# Patient Record
Sex: Male | Born: 1985 | Race: Black or African American | Hispanic: No | Marital: Married | Smoking: Never smoker
Health system: Southern US, Community
[De-identification: ages and names within clinical notes are randomized; demographics above are authoritative.]

## PROBLEM LIST (undated history)

## (undated) DIAGNOSIS — I2699 Other pulmonary embolism without acute cor pulmonale: Secondary | ICD-10-CM

## (undated) DIAGNOSIS — D571 Sickle-cell disease without crisis: Secondary | ICD-10-CM

## (undated) DIAGNOSIS — I249 Acute ischemic heart disease, unspecified: Secondary | ICD-10-CM

## (undated) DIAGNOSIS — I639 Cerebral infarction, unspecified: Secondary | ICD-10-CM

## (undated) HISTORY — PX: APPENDECTOMY: SHX54

---

## 2010-01-19 ENCOUNTER — Emergency Department (HOSPITAL_COMMUNITY): Admission: EM | Admit: 2010-01-19 | Discharge: 2010-01-19 | Payer: Self-pay | Admitting: Emergency Medicine

## 2010-03-16 ENCOUNTER — Emergency Department (HOSPITAL_COMMUNITY): Admission: EM | Admit: 2010-03-16 | Discharge: 2010-03-17 | Payer: Self-pay | Admitting: Emergency Medicine

## 2010-03-18 ENCOUNTER — Inpatient Hospital Stay (HOSPITAL_COMMUNITY): Admission: EM | Admit: 2010-03-18 | Discharge: 2010-03-23 | Payer: Self-pay | Admitting: Emergency Medicine

## 2010-09-24 ENCOUNTER — Emergency Department (HOSPITAL_COMMUNITY)
Admission: EM | Admit: 2010-09-24 | Discharge: 2010-09-24 | Payer: Self-pay | Source: Home / Self Care | Admitting: Emergency Medicine

## 2010-12-31 LAB — DIFFERENTIAL
Basophils Relative: 1 % (ref 0–1)
Eosinophils Relative: 0 % (ref 0–5)
Lymphocytes Relative: 17 % (ref 12–46)
Lymphs Abs: 0.9 10*3/uL (ref 0.7–4.0)
Monocytes Relative: 9 % (ref 3–12)

## 2010-12-31 LAB — CBC
HCT: 31.4 % — ABNORMAL LOW (ref 39.0–52.0)
MCHC: 34.1 g/dL (ref 30.0–36.0)
MCV: 88.7 fL (ref 78.0–100.0)
Platelets: 642 10*3/uL — ABNORMAL HIGH (ref 150–400)
RDW: 16.6 % — ABNORMAL HIGH (ref 11.5–15.5)
WBC: 5.2 10*3/uL (ref 4.0–10.5)

## 2010-12-31 LAB — BASIC METABOLIC PANEL
BUN: 10 mg/dL (ref 6–23)
CO2: 25 mEq/L (ref 19–32)
Glucose, Bld: 90 mg/dL (ref 70–99)

## 2011-01-06 LAB — BASIC METABOLIC PANEL
BUN: 10 mg/dL (ref 6–23)
BUN: 9 mg/dL (ref 6–23)
CO2: 33 mEq/L — ABNORMAL HIGH (ref 19–32)
Calcium: 9 mg/dL (ref 8.4–10.5)
Calcium: 9.2 mg/dL (ref 8.4–10.5)
Chloride: 104 mEq/L (ref 96–112)
Creatinine, Ser: 0.65 mg/dL (ref 0.4–1.5)
Creatinine, Ser: 0.59 mg/dL (ref 0.4–1.5)
GFR calc Af Amer: >60 mL/min (ref >60)
GFR calc Af Amer: >60 mL/min (ref >60)
GFR calc non Af Amer: >60 mL/min (ref >60)
Glucose, Bld: 98 mg/dL (ref 70–99)

## 2011-01-06 LAB — COMPREHENSIVE METABOLIC PANEL
Albumin: 3.3 g/dL — ABNORMAL LOW (ref 3.5–5.2)
Calcium: 8.5 mg/dL (ref 8.4–10.5)
Chloride: 110 mEq/L (ref 96–112)
Creatinine, Ser: 0.5 mg/dL (ref 0.4–1.5)
Glucose, Bld: 101 mg/dL — ABNORMAL HIGH (ref 70–99)
Sodium: 142 mEq/L (ref 135–145)
Total Protein: 5.9 g/dL — ABNORMAL LOW (ref 6.0–8.3)

## 2011-01-06 LAB — DIFFERENTIAL
Basophils Absolute: 0.1 10*3/uL (ref 0.0–0.1)
Basophils Relative: 0 % (ref 0–1)
Eosinophils Relative: 0 % (ref 0–5)
Eosinophils Relative: 4 % (ref 0–5)
Lymphocytes Relative: 11 % — ABNORMAL LOW (ref 12–46)
Lymphocytes Relative: 32 % (ref 12–46)
Lymphs Abs: 2.4 10*3/uL (ref 0.7–4.0)
Monocytes Absolute: 0.5 10*3/uL (ref 0.1–1.0)
Monocytes Absolute: 0.8 10*3/uL (ref 0.1–1.0)
Monocytes Relative: 10 % (ref 3–12)
Monocytes Relative: 12 % (ref 3–12)
Neutro Abs: 2.8 10*3/uL (ref 1.7–7.7)
Neutro Abs: 6.1 10*3/uL (ref 1.7–7.7)
Neutrophils Relative %: 29 % — ABNORMAL LOW (ref 43–77)
Neutrophils Relative %: 79 % — ABNORMAL HIGH (ref 43–77)

## 2011-01-06 LAB — CBC
Hemoglobin: 10.4 g/dL — ABNORMAL LOW (ref 13.0–17.0)
Hemoglobin: 9.9 g/dL — ABNORMAL LOW (ref 13.0–17.0)
Hemoglobin: 10.5 g/dL — ABNORMAL LOW (ref 13.0–17.0)
Hemoglobin: 12 g/dL — ABNORMAL LOW (ref 13.0–17.0)
Hemoglobin: 9.8 g/dL — ABNORMAL LOW (ref 13.0–17.0)
MCV: 91.4 fL (ref 78.0–100.0)
Platelets: 251 10*3/uL (ref 150–400)
Platelets: 258 10*3/uL (ref 150–400)
RBC: 3.22 MIL/uL — ABNORMAL LOW (ref 4.22–5.81)
RBC: 3.16 MIL/uL — ABNORMAL LOW (ref 4.22–5.81)
RDW: 15.6 % — ABNORMAL HIGH (ref 11.5–15.5)
RDW: 17.1 % — ABNORMAL HIGH (ref 11.5–15.5)
RDW: 16.6 % — ABNORMAL HIGH (ref 11.5–15.5)
RDW: 17 % — ABNORMAL HIGH (ref 11.5–15.5)
WBC: 5 10*3/uL (ref 4.0–10.5)
WBC: 4.4 10*3/uL (ref 4.0–10.5)

## 2011-01-06 LAB — POCT I-STAT, CHEM 8
Calcium, Ion: 1.07 mmol/L — ABNORMAL LOW (ref 1.12–1.32)
Creatinine, Ser: 0.4 mg/dL (ref 0.4–1.5)
Glucose, Bld: 117 mg/dL — ABNORMAL HIGH (ref 70–99)
HCT: 33 % — ABNORMAL LOW (ref 39.0–52.0)
Hemoglobin: 11.2 g/dL — ABNORMAL LOW (ref 13.0–17.0)
Potassium: 3.1 mEq/L — ABNORMAL LOW (ref 3.5–5.1)
TCO2: 25 mmol/L (ref 0–100)

## 2011-01-06 LAB — RETICULOCYTES
RBC.: 3.85 MIL/uL — ABNORMAL LOW (ref 4.22–5.81)
Retic Count, Absolute: 319.6 10*3/uL — ABNORMAL HIGH (ref 19.0–186.0)
Retic Ct Pct: 8.3 % — ABNORMAL HIGH (ref 0.4–3.1)

## 2011-01-06 LAB — URINALYSIS, ROUTINE W REFLEX MICROSCOPIC
Glucose, UA: NEGATIVE mg/dL
Hgb urine dipstick: NEGATIVE
Protein, ur: NEGATIVE mg/dL

## 2011-01-06 LAB — URINE MICROSCOPIC-ADD ON

## 2011-01-08 LAB — CBC
Platelets: 384 10*3/uL (ref 150–400)
RBC: 3.12 MIL/uL — ABNORMAL LOW (ref 4.22–5.81)
RDW: 17.2 % — ABNORMAL HIGH (ref 11.5–15.5)
WBC: 9.9 10*3/uL (ref 4.0–10.5)

## 2011-01-08 LAB — POCT I-STAT, CHEM 8
BUN: 12 mg/dL (ref 6–23)
Calcium, Ion: 1.13 mmol/L (ref 1.12–1.32)
Chloride: 108 mEq/L (ref 96–112)
Glucose, Bld: 103 mg/dL — ABNORMAL HIGH (ref 70–99)
HCT: 31 % — ABNORMAL LOW (ref 39.0–52.0)
TCO2: 23 mmol/L (ref 0–100)

## 2011-01-08 LAB — DIFFERENTIAL
Basophils Absolute: 0 10*3/uL (ref 0.0–0.1)
Basophils Relative: 0 % (ref 0–1)
Eosinophils Absolute: 0 10*3/uL (ref 0.0–0.7)
Neutro Abs: 7.8 10*3/uL — ABNORMAL HIGH (ref 1.7–7.7)

## 2011-01-08 LAB — POCT CARDIAC MARKERS: Troponin i, poc: <0.05 ng/mL (ref 0.00–0.09)

## 2011-01-08 LAB — RETICULOCYTES: Retic Count, Absolute: 117.7 10*3/uL (ref 19.0–186.0)

## 2011-10-06 ENCOUNTER — Encounter: Payer: Self-pay | Admitting: Emergency Medicine

## 2011-10-06 ENCOUNTER — Other Ambulatory Visit: Payer: Self-pay

## 2011-10-06 ENCOUNTER — Observation Stay (HOSPITAL_COMMUNITY)
Admission: EM | Admit: 2011-10-06 | Discharge: 2011-10-07 | Disposition: A | Discharge status: Home / Self Care | Payer: PRIVATE HEALTH INSURANCE | Attending: Emergency Medicine | Admitting: Emergency Medicine

## 2011-10-06 ENCOUNTER — Emergency Department (HOSPITAL_COMMUNITY): Payer: PRIVATE HEALTH INSURANCE

## 2011-10-06 DIAGNOSIS — D5700 Hb-SS disease with crisis, unspecified: Principal | ICD-10-CM | POA: Insufficient documentation

## 2011-10-06 HISTORY — DX: Sickle-cell disease without crisis: D57.1

## 2011-10-06 LAB — DIFFERENTIAL
Basophils Absolute: 0.1 10*3/uL (ref 0.0–0.1)
Lymphocytes Relative: 10 % — ABNORMAL LOW (ref 12–46)
Neutro Abs: 9.3 10*3/uL — ABNORMAL HIGH (ref 1.7–7.7)

## 2011-10-06 LAB — BASIC METABOLIC PANEL
CO2: 24 mEq/L (ref 19–32)
Chloride: 104 mEq/L (ref 96–112)
Potassium: 3.8 mEq/L (ref 3.5–5.1)
Sodium: 139 mEq/L (ref 135–145)

## 2011-10-06 LAB — CBC
Platelets: 837 10*3/uL — ABNORMAL HIGH (ref 150–400)
RDW: 19.4 % — ABNORMAL HIGH (ref 11.5–15.5)
WBC: 11.2 10*3/uL — ABNORMAL HIGH (ref 4.0–10.5)

## 2011-10-06 LAB — RETICULOCYTES
Retic Count, Absolute: 397.8 10*3/uL — ABNORMAL HIGH (ref 19.0–186.0)
Retic Ct Pct: 15.3 % — ABNORMAL HIGH (ref 0.4–3.1)

## 2011-10-06 MED ORDER — HYDROMORPHONE HCL PF 1 MG/ML IJ SOLN
2.0000 mg | INTRAMUSCULAR | Status: DC | PRN
  Administered 2011-10-07 (×3): 2 mg via INTRAVENOUS
  Filled 2011-10-06 (×3): qty 2

## 2011-10-06 MED ORDER — SODIUM CHLORIDE 0.9 % IV SOLN
Freq: Once | INTRAVENOUS | Status: AC
  Administered 2011-10-06: 1000 mL/h via INTRAVENOUS

## 2011-10-06 MED ORDER — HYDROMORPHONE HCL PF 2 MG/ML IJ SOLN
2.0000 mg | Freq: Once | INTRAMUSCULAR | Status: AC
  Administered 2011-10-06: 2 mg via INTRAVENOUS
  Filled 2011-10-06: qty 1

## 2011-10-06 MED ORDER — SODIUM CHLORIDE 0.9 % IV SOLN
INTRAVENOUS | Status: DC
  Administered 2011-10-07: 03:00:00 via INTRAVENOUS

## 2011-10-06 NOTE — ED Provider Notes (Signed)
History     CSN: [id number] Arrival date & time: 10/06/2011  8:45 PM   First MD Initiated Contact with Patient 10/06/11 2232      No chief complaint on file.   (Consider location/radiation/quality/duration/timing/severity/associated sxs/prior treatment) HPI Comments: History of sickle cell disease.  Is from [place] where he usually receives care.  Pain started yesterday and is diffuse, but worst in his back.  No fever or chills.  Patient is a 25 y.o. male presenting with sickle cell pain. The history is provided by the patient.  Sickle Cell Pain Crisis  This is a recurrent problem. The current episode started yesterday. The problem occurs occasionally. The problem has been rapidly worsening. The pain location is generalized. Site of pain is localized in bone. The pain is moderate.    Past Medical History  Diagnosis Date  . Sickle cell anemia     Past Surgical History  Procedure Date  . Appendectomy     No family history on file.  History  Substance Use Topics  . Smoking status: Never Smoker   . Smokeless tobacco: Not on file  . Alcohol Use: No      Review of Systems  All other systems reviewed and are negative.    Allergies  Sulfa antibiotics  Home Medications   Current Outpatient Rx  Name Route Sig Dispense Refill  . FOLIC ACID 1 MG PO TABS Oral Take 1 mg by mouth daily.      Marland Kitchen HYDROXYUREA 500 MG PO CAPS Oral Take 1,000 mg by mouth daily. May take with food to minimize GI side effects.     . OXYCODONE HCL ER 10 MG PO TB12 Oral Take 10 mg by mouth every 4 (four) hours as needed. For pain.       BP 116/66  Pulse 113  Temp(Src) 99.4 F (37.4 C) (Oral)  Resp 20  SpO2 98%  Physical Exam  Nursing note and vitals reviewed. Constitutional: He is oriented to person, place, and time. He appears well-developed and well-nourished.  HENT:  Head: Normocephalic and atraumatic.  Neck: Normal range of motion. Neck supple.  Cardiovascular: Normal rate and regular  rhythm.  Exam reveals no friction rub.   No murmur heard. Pulmonary/Chest: Effort normal and breath sounds normal. No respiratory distress.  Abdominal: Soft. Bowel sounds are normal. He exhibits no distension. There is no tenderness.  Musculoskeletal: Normal range of motion. He exhibits no edema.  Neurological: He is alert and oriented to person, place, and time.  Skin: Skin is warm and dry.    ED Course  Procedures (including critical care time)  Labs Reviewed  RETICULOCYTES - Abnormal; Notable for the following:    Retic Ct Pct 15.3 (*)    RBC. 2.60 (*)    Retic Count, Manual 397.8 (*)    All other components within normal limits  CBC - Abnormal; Notable for the following:    WBC 11.2 (*)    RBC 2.60 (*)    Hemoglobin 8.9 (*)    HCT 25.8 (*)    MCH 34.2 (*)    RDW 19.4 (*)    Platelets 837 (*)    All other components within normal limits  DIFFERENTIAL - Abnormal; Notable for the following:    Neutrophils Relative 83 (*)    Neutro Abs 9.3 (*)    Lymphocytes Relative 10 (*)    All other components within normal limits  BASIC METABOLIC PANEL - Abnormal; Notable for the following:    Glucose,  Bld 101 (*)    All other components within normal limits   Dg Chest 2 View  10/06/2011  *RADIOLOGY REPORT*  Clinical Data: Chest pain, sickle cell crisis  CHEST - 2 VIEW  Comparison:  03/16/2010  Findings:  The heart size and mediastinal contours are within normal limits.  Both lungs are clear.  The visualized skeletal structures are unremarkable.  IMPRESSION: No active cardiopulmonary disease.  Original Report Authenticated By: Judie Petit. Ruel Favors, M.D.     No diagnosis found.    MDM  Patient will be moved to the CDU for sickle cell protocol.          Geoffery Lyons, MD 10/06/11 [phone]

## 2011-10-06 NOTE — ED Notes (Signed)
PT. REPORTS SICKLE CELL CRISIS WITH RIGHT CHEST PAIN ONSET  LAST Friday .  SLIGHT NAUSEA.

## 2011-10-07 MED ORDER — OXYCODONE-ACETAMINOPHEN 5-325 MG PO TABS: 1.0000 | ORAL_TABLET | ORAL | Status: AC | PRN

## 2011-10-07 NOTE — ED Notes (Signed)
patient here for sickle cell crisis. Rates pain as 8/10.

## 2011-11-30 ENCOUNTER — Encounter (HOSPITAL_COMMUNITY): Payer: Self-pay | Admitting: Emergency Medicine

## 2011-11-30 ENCOUNTER — Emergency Department (HOSPITAL_COMMUNITY)
Admission: EM | Admit: 2011-11-30 | Discharge: 2011-12-01 | Disposition: A | Discharge status: Home / Self Care | Payer: PRIVATE HEALTH INSURANCE | Attending: Emergency Medicine | Admitting: Emergency Medicine

## 2011-11-30 DIAGNOSIS — M25519 Pain in unspecified shoulder: Secondary | ICD-10-CM | POA: Insufficient documentation

## 2011-11-30 DIAGNOSIS — D5700 Hb-SS disease with crisis, unspecified: Secondary | ICD-10-CM | POA: Insufficient documentation

## 2011-11-30 DIAGNOSIS — M79609 Pain in unspecified limb: Secondary | ICD-10-CM | POA: Insufficient documentation

## 2011-11-30 LAB — DIFFERENTIAL
Basophils Relative: 0 % (ref 0–1)
Eosinophils Absolute: 0.1 10*3/uL (ref 0.0–0.7)
Lymphs Abs: 0.6 10*3/uL — ABNORMAL LOW (ref 0.7–4.0)
Monocytes Absolute: 0.8 10*3/uL (ref 0.1–1.0)
Monocytes Relative: 9 % (ref 3–12)
Neutro Abs: 7.3 10*3/uL (ref 1.7–7.7)

## 2011-11-30 LAB — RETICULOCYTES
RBC.: 2.51 MIL/uL — ABNORMAL LOW (ref 4.22–5.81)
Retic Ct Pct: 19.5 % — ABNORMAL HIGH (ref 0.4–3.1)

## 2011-11-30 LAB — POCT I-STAT, CHEM 8
BUN: 4 mg/dL — ABNORMAL LOW (ref 6–23)
Chloride: 104 mEq/L (ref 96–112)
Creatinine, Ser: 0.5 mg/dL (ref 0.50–1.35)
Glucose, Bld: 97 mg/dL (ref 70–99)
Potassium: 3.8 mEq/L (ref 3.5–5.1)
Sodium: 141 mEq/L (ref 135–145)

## 2011-11-30 LAB — CBC
HCT: 25.9 % — ABNORMAL LOW (ref 39.0–52.0)
Hemoglobin: 9.2 g/dL — ABNORMAL LOW (ref 13.0–17.0)
MCH: 36.7 pg — ABNORMAL HIGH (ref 26.0–34.0)
MCHC: 35.5 g/dL (ref 30.0–36.0)
RBC: 2.51 MIL/uL — ABNORMAL LOW (ref 4.22–5.81)

## 2011-11-30 LAB — ETHANOL: Alcohol, Ethyl (B): <11 mg/dL (ref 0–11)

## 2011-11-30 MED ORDER — DIPHENHYDRAMINE HCL 50 MG/ML IJ SOLN
25.0000 mg | Freq: Once | INTRAMUSCULAR | Status: AC
  Administered 2011-11-30: via INTRAVENOUS
  Filled 2011-11-30: qty 1

## 2011-11-30 MED ORDER — ZIPRASIDONE MESYLATE 20 MG IM SOLR: 20.0000 mg | Freq: Once | INTRAMUSCULAR | Status: DC

## 2011-11-30 MED ORDER — FOLIC ACID 1 MG PO TABS
1.0000 mg | ORAL_TABLET | Freq: Once | ORAL | Status: DC
  Filled 2011-11-30: qty 1

## 2011-11-30 MED ORDER — THIAMINE HCL 100 MG/ML IJ SOLN: 100.0000 mg | Freq: Once | INTRAMUSCULAR | Status: DC

## 2011-11-30 MED ORDER — ONDANSETRON HCL 4 MG/2ML IJ SOLN
4.0000 mg | Freq: Once | INTRAMUSCULAR | Status: AC
  Administered 2011-11-30: 4 mg via INTRAVENOUS
  Filled 2011-11-30: qty 2

## 2011-11-30 MED ORDER — SODIUM CHLORIDE 0.9 % IV BOLUS (SEPSIS)
1000.0000 mL | Freq: Once | INTRAVENOUS | Status: AC
  Administered 2011-11-30: 1000 mL via INTRAVENOUS

## 2011-11-30 MED ORDER — SODIUM CHLORIDE 0.9 % IV BOLUS (SEPSIS): 1000.0000 mL | Freq: Once | INTRAVENOUS | Status: DC

## 2011-11-30 MED ORDER — HYDROMORPHONE HCL PF 2 MG/ML IJ SOLN
2.0000 mg | Freq: Once | INTRAMUSCULAR | Status: AC
  Administered 2011-11-30: 2 mg via INTRAVENOUS
  Filled 2011-11-30: qty 1

## 2011-11-30 NOTE — ED Notes (Signed)
C/o sickle cell crisis since Wednesday night.  C/o pain to R arm, R shoulder, and back.

## 2011-12-01 LAB — COMPREHENSIVE METABOLIC PANEL
Albumin: 3.9 g/dL (ref 3.5–5.2)
Alkaline Phosphatase: 61 U/L (ref 39–117)
BUN: 6 mg/dL (ref 6–23)
Chloride: 102 mEq/L (ref 96–112)
Creatinine, Ser: 0.54 mg/dL (ref 0.50–1.35)
GFR calc Af Amer: >90 mL/min (ref >90)
Glucose, Bld: 88 mg/dL (ref 70–99)
Potassium: 3.6 mEq/L (ref 3.5–5.1)
Total Bilirubin: 2 mg/dL — ABNORMAL HIGH (ref 0.3–1.2)

## 2011-12-01 MED ORDER — HYDROMORPHONE HCL PF 2 MG/ML IJ SOLN
INTRAMUSCULAR | Status: AC
  Filled 2011-12-01: qty 1

## 2011-12-01 MED ORDER — HYDROMORPHONE HCL PF 2 MG/ML IJ SOLN
2.0000 mg | Freq: Once | INTRAMUSCULAR | Status: AC
  Administered 2011-12-01: 2 mg via INTRAVENOUS

## 2011-12-01 MED ORDER — HYDROMORPHONE HCL PF 2 MG/ML IJ SOLN
2.0000 mg | Freq: Once | INTRAMUSCULAR | Status: AC
  Administered 2011-12-01: 2 mg via INTRAVENOUS
  Filled 2011-12-01: qty 1

## 2011-12-01 NOTE — ED Provider Notes (Cosign Needed)
History     CSN: [id number]  Arrival date & time 11/30/11  2145   First MD Initiated Contact with Patient 11/30/11 2245      Chief Complaint  Patient presents with  . Sickle Cell Pain Crisis    (Consider location/radiation/quality/duration/timing/severity/associated sxs/prior treatment) HPI Comments: The patient is a 26 year old man with sickle cell disease. He started having pain in his right arm and right shoulder on Wednesday, 5 days ago. The pain is gotten progressively worse. He is taken oxycodone without relief. He therefore seeks evaluation.  Patient is a 26 y.o. male presenting with sickle cell pain. The history is provided by the patient and medical records. No language interpreter was used.  Sickle Cell Pain Crisis  This is a recurrent problem. The current episode started 3 to 5 days ago. The onset was gradual. The problem occurs continuously. The problem has been gradually worsening. The pain is present in the right side. Site of pain is localized in bone. The pain is similar to prior episodes. The pain is severe. The symptoms are relieved by nothing. Associated symptoms include joint pain. Pertinent negatives include no loss of sensation, no tingling and no weakness. He sickle cell is of an an unknown genotype. There is no history of acute chest syndrome. There have been frequent pain crises. There is no history of platelet sequestration. There is no history of stroke. He has not been treated with chronic transfusion therapy. He has been treated with hydroxyurea.    Past Medical History  Diagnosis Date  . Sickle cell anemia     Past Surgical History  Procedure Date  . Appendectomy     No family history on file.  History  Substance Use Topics  . Smoking status: Never Smoker   . Smokeless tobacco: Not on file  . Alcohol Use: No      Review of Systems  Constitutional: Negative.   HENT: Negative.   Eyes: Negative.   Respiratory: Negative.   Cardiovascular:  Negative.   Gastrointestinal: Negative.   Genitourinary: Negative.   Musculoskeletal: Positive for joint pain.       Pain in right arm and right shoulder.  Skin: Negative.   Neurological: Negative.  Negative for tingling and weakness.  Psychiatric/Behavioral: Negative.     Allergies  Sulfa antibiotics  Home Medications   Current Outpatient Rx  Name Route Sig Dispense Refill  . FOLIC ACID 1 MG PO TABS Oral Take 1 mg by mouth daily.      Marland Kitchen HYDROXYUREA 500 MG PO CAPS Oral Take 1,000 mg by mouth daily. May take with food to minimize GI side effects.     . OXYCODONE HCL ER 10 MG PO TB12 Oral Take 10 mg by mouth every 4 (four) hours as needed. For pain.       BP 121/74  Pulse 120  Temp(Src) 99.1 F (37.3 C) (Oral)  Resp 18  SpO2 98%  Physical Exam  Nursing note and vitals reviewed. Constitutional: He is oriented to person, place, and time. He appears well-developed and well-nourished.       In moderate distress with pain in the right arm and shoulder and right lateral chest.  HENT:  Head: Normocephalic and atraumatic.  Right Ear: External ear normal.  Left Ear: External ear normal.  Mouth/Throat: Oropharynx is clear and moist.  Eyes: Conjunctivae and EOM are normal. Pupils are equal, round, and reactive to light.  Neck: Normal range of motion. Neck supple.  Cardiovascular: Normal rate, regular rhythm  and normal heart sounds.   Pulmonary/Chest: Effort normal and breath sounds normal.  Abdominal: Soft. Bowel sounds are normal.  Musculoskeletal: Normal range of motion.  Neurological: He is alert and oriented to person, place, and time.       No sensory or motor deficit.   Skin: Skin is warm and dry.  Psychiatric: He has a normal mood and affect. His behavior is normal.    ED Course  Procedures (including critical care time)  Labs Reviewed  POCT I-STAT, CHEM 8 - Abnormal; Notable for the following:    BUN 4 (*)    Calcium, Ion 1.06 (*)    Hemoglobin 9.9 (*)    HCT  29.0 (*)    All other components within normal limits  CBC - Abnormal; Notable for the following:    RBC 2.51 (*)    Hemoglobin 9.2 (*)    HCT 25.9 (*)    MCV 103.2 (*)    MCH 36.7 (*)    RDW 20.6 (*)    All other components within normal limits  DIFFERENTIAL - Abnormal; Notable for the following:    Neutrophils Relative 83 (*)    Lymphocytes Relative 7 (*)    Lymphs Abs 0.6 (*)    All other components within normal limits  COMPREHENSIVE METABOLIC PANEL - Abnormal; Notable for the following:    AST 46 (*) HEMOLYSIS AT THIS LEVEL MAY AFFECT RESULT   Total Bilirubin 2.0 (*)    All other components within normal limits  RETICULOCYTES - Abnormal; Notable for the following:    Retic Ct Pct 19.5 (*)    RBC. 2.51 (*)    Retic Count, Manual 489.5 (*)    All other components within normal limits  ETHANOL  URINALYSIS, ROUTINE W REFLEX MICROSCOPIC    1:00 AM Pt seen --> physical exam performed. Lab workup performed. IV fluids, oxygen, Dilaudid, Zofran and Benadryl ordered. Observe to see if his pain will be controllable.  1. Sickle cell crisis     Signed out to Dr. Hyacinth Meeker at 1:00 A.M.       Carleene Cooper III, MD 12/01/11 [phone]

## 2011-12-01 NOTE — ED Provider Notes (Signed)
  Physical Exam  BP 114/68  Pulse 98  Temp(Src) 99.1 F (37.3 C) (Oral)  Resp 16  SpO2 93%  Physical Exam  ED Course  Procedures  MDM Patient received in change of shift. He has had right arm and shoulder pain for the last 5 days. He is taking oxycodone at home without significant improvement of his pain. He denies fevers, chills, shortness of breath, chest pain, back pain, dysuria. He denies any swelling of his arm. He states that this is a common exacerbation for him. He is usually seen at [facility] or at [facility].    Physical exam, tachycardia has resolved, patient states the pain is now 8/10, no swelling of the right arm, normal pulses and range of motion of the right arm, abdomen soft and lungs and heart are clear without any rales, wheezing, respiratory distress or tachycardia. Abdomen is soft and nontender  Assessment:  Repeat pain medication, and hydromorphone 2 mg given 2 hours ago, will repeat the dose and then repeat again. Will need admission if no significant improvement.  4:00 AM, patient reevaluated and has minimal pain in his amenable to discharge at this time      Vida Roller, MD 12/01/11 [phone]

## 2011-12-01 NOTE — ED Notes (Signed)
Urine requested patient states unable

## 2011-12-01 NOTE — ED Notes (Signed)
Patient reports sickle cell crisis started on Wednesday states pain in right shoulder and left arm rates it 10/10

## 2011-12-25 ENCOUNTER — Observation Stay (HOSPITAL_COMMUNITY)
Admission: EM | Admit: 2011-12-25 | Discharge: 2011-12-26 | Disposition: A | Discharge status: Home Health | Payer: PRIVATE HEALTH INSURANCE | Attending: Emergency Medicine | Admitting: Emergency Medicine

## 2011-12-25 ENCOUNTER — Emergency Department (HOSPITAL_COMMUNITY): Payer: PRIVATE HEALTH INSURANCE

## 2011-12-25 ENCOUNTER — Encounter (HOSPITAL_COMMUNITY): Payer: Self-pay | Admitting: Emergency Medicine

## 2011-12-25 DIAGNOSIS — R000 Tachycardia, unspecified: Secondary | ICD-10-CM | POA: Insufficient documentation

## 2011-12-25 DIAGNOSIS — R112 Nausea with vomiting, unspecified: Secondary | ICD-10-CM | POA: Insufficient documentation

## 2011-12-25 DIAGNOSIS — D5700 Hb-SS disease with crisis, unspecified: Principal | ICD-10-CM | POA: Insufficient documentation

## 2011-12-25 LAB — HEPATIC FUNCTION PANEL
Albumin: 4.6 g/dL (ref 3.5–5.2)
Alkaline Phosphatase: 56 U/L (ref 39–117)
Bilirubin, Direct: 0.4 mg/dL — ABNORMAL HIGH (ref 0.0–0.3)
Total Bilirubin: 1.8 mg/dL — ABNORMAL HIGH (ref 0.3–1.2)

## 2011-12-25 LAB — CBC
MCH: 36.2 pg — ABNORMAL HIGH (ref 26.0–34.0)
MCHC: 35.4 g/dL (ref 30.0–36.0)
MCV: 102.3 fL — ABNORMAL HIGH (ref 78.0–100.0)
Platelets: 577 10*3/uL — ABNORMAL HIGH (ref 150–400)
RBC: 2.57 MIL/uL — ABNORMAL LOW (ref 4.22–5.81)
RDW: 19.4 % — ABNORMAL HIGH (ref 11.5–15.5)

## 2011-12-25 LAB — RETICULOCYTES
RBC.: 2.57 MIL/uL — ABNORMAL LOW (ref 4.22–5.81)
Retic Ct Pct: 11.9 % — ABNORMAL HIGH (ref 0.4–3.1)

## 2011-12-25 LAB — BASIC METABOLIC PANEL
CO2: 25 mEq/L (ref 19–32)
Calcium: 9.2 mg/dL (ref 8.4–10.5)
Creatinine, Ser: 0.66 mg/dL (ref 0.50–1.35)
GFR calc Af Amer: >90 mL/min (ref >90)
GFR calc non Af Amer: >90 mL/min (ref >90)
Sodium: 140 mEq/L (ref 135–145)

## 2011-12-25 LAB — DIFFERENTIAL
Basophils Absolute: 0 10*3/uL (ref 0.0–0.1)
Eosinophils Absolute: 0 10*3/uL (ref 0.0–0.7)
Lymphs Abs: 1.1 10*3/uL (ref 0.7–4.0)
Monocytes Absolute: 0.6 10*3/uL (ref 0.1–1.0)
Neutrophils Relative %: 77 % (ref 43–77)

## 2011-12-25 LAB — LIPASE, BLOOD: Lipase: 15 U/L (ref 11–59)

## 2011-12-25 MED ORDER — DIPHENHYDRAMINE HCL 50 MG/ML IJ SOLN
12.5000 mg | Freq: Four times a day (QID) | INTRAMUSCULAR | Status: DC | PRN
  Administered 2011-12-26 (×2): 12.5 mg via INTRAVENOUS
  Filled 2011-12-25 (×2): qty 1

## 2011-12-25 MED ORDER — ONDANSETRON HCL 4 MG/2ML IJ SOLN
4.0000 mg | Freq: Once | INTRAMUSCULAR | Status: AC
  Administered 2011-12-25: 4 mg via INTRAVENOUS
  Filled 2011-12-25: qty 2

## 2011-12-25 MED ORDER — DIPHENHYDRAMINE HCL 50 MG/ML IJ SOLN
25.0000 mg | Freq: Once | INTRAMUSCULAR | Status: AC
  Administered 2011-12-25: 25 mg via INTRAVENOUS
  Filled 2011-12-25: qty 1

## 2011-12-25 MED ORDER — DEXTROSE-NACL 5-0.45 % IV SOLN
INTRAVENOUS | Status: DC
  Administered 2011-12-25 – 2011-12-26 (×4): via INTRAVENOUS

## 2011-12-25 MED ORDER — HYDROMORPHONE HCL PF 1 MG/ML IJ SOLN
1.0000 mg | Freq: Once | INTRAMUSCULAR | Status: DC
  Administered 2011-12-25: 1 mg via INTRAVENOUS
  Filled 2011-12-25: qty 1

## 2011-12-25 MED ORDER — ONDANSETRON HCL 4 MG/2ML IJ SOLN
4.0000 mg | Freq: Four times a day (QID) | INTRAMUSCULAR | Status: DC | PRN
  Administered 2011-12-26 (×2): 4 mg via INTRAVENOUS
  Filled 2011-12-25 (×2): qty 2

## 2011-12-25 MED ORDER — HYDROMORPHONE HCL PF 2 MG/ML IJ SOLN
2.0000 mg | Freq: Once | INTRAMUSCULAR | Status: AC
  Administered 2011-12-25: 1 mg via INTRAVENOUS
  Filled 2011-12-25: qty 1

## 2011-12-25 MED ORDER — HYDROMORPHONE HCL PF 1 MG/ML IJ SOLN
2.0000 mg | INTRAMUSCULAR | Status: DC | PRN
  Administered 2011-12-26 (×3): 2 mg via INTRAVENOUS
  Filled 2011-12-25: qty 1
  Filled 2011-12-25 (×2): qty 2
  Filled 2011-12-25: qty 1

## 2011-12-25 NOTE — ED Notes (Signed)
PT. REPORTS PROGRESSING GENERALIZED BODY ACHES Justin Sharp FOR 4 DAYS WITH NAUSEA , DENIES FEVER OR CHILLS. NO SOB.

## 2011-12-25 NOTE — ED Notes (Signed)
The pt says his pain is  Still there.  8/10

## 2011-12-25 NOTE — ED Provider Notes (Signed)
History     CSN: [id number]  Arrival date & time 12/25/11  2119   First MD Initiated Contact with Patient 12/25/11 2145      Chief Complaint  Patient presents with  . Sickle Cell Pain Crisis    (Consider location/radiation/quality/duration/timing/severity/associated sxs/prior treatment) HPI Patient is a 26 year old male with a history of sickle cell disease who presents today complaining of pain typical of prior pain crises. He complains of 8/10 pain in the bilateral lower shiny some lower back. He denies any chest pain or shortness of breath. He denies any fevers. He was last transfused approximately 6 weeks ago. He has not had any fevers or difficulty breathing. Patient does endorse some nausea and an episode of vomiting. He denies any urinary symptoms or focal abdominal pain. Patient does have a history of an appendectomy as well as acute chest crisis. He's not having pain similar to these events. Patient had his home pain medication but was unable control his symptoms. There are no other associated or modifying factors. Past Medical History  Diagnosis Date  . Sickle cell anemia     Past Surgical History  Procedure Date  . Appendectomy     No family history on file.  History  Substance Use Topics  . Smoking status: Never Smoker   . Smokeless tobacco: Not on file  . Alcohol Use: No      Review of Systems  Constitutional: Negative.   HENT: Negative.   Eyes: Negative.   Respiratory: Negative.   Cardiovascular: Negative.   Gastrointestinal: Positive for nausea, vomiting and abdominal pain.  Genitourinary: Negative.   Musculoskeletal:       Muscle pain in the lower extremities as well as arthralgias consistent with prior sickle cell pain crises  Skin: Negative.   Neurological: Negative.   Hematological: Negative.   Psychiatric/Behavioral: Negative.   All other systems reviewed and are negative.    Allergies  Sulfa antibiotics  Home Medications   Current  Outpatient Rx  Name Route Sig Dispense Refill  . FOLIC ACID 1 MG PO TABS Oral Take 1 mg by mouth daily.      Marland Kitchen HYDROXYUREA 500 MG PO CAPS Oral Take 1,000 mg by mouth daily. May take with food to minimize GI side effects.     . OXYCODONE HCL 5 MG PO CAPS Oral Take 10 mg by mouth every 4 (four) hours as needed. For pain      BP 113/69  Pulse 103  Temp(Src) 98.6 F (37 C) (Oral)  Resp 16  SpO2 94%  Physical Exam  Nursing note and vitals reviewed. GEN: Well-developed, well-nourished male in no distress, uncomfortable appearing HEENT: Atraumatic, normocephalic. Oropharynx clear without erythema EYES: PERRLA BL, no scleral icterus. NECK: Trachea midline, no meningismus CV: regular rate and rhythm. No murmurs, rubs, or gallops PULM: No respiratory distress.  No crackles, wheezes, or rales. GI: soft, non-tender. No guarding, rebound, or tenderness. + bowel sounds  Neuro: cranial nerves 2-12 intact, no abnormalities of strength or sensation, A and O x 3 MSK: Patient moves all 4 extremities symmetrically, no deformity, edema, or injury noted Psych: no abnormality of mood   ED Course  Procedures (including critical care time)  Labs Reviewed  CBC - Abnormal; Notable for the following:    RBC 2.57 (*)    Hemoglobin 9.3 (*)    HCT 26.3 (*)    MCV 102.3 (*)    MCH 36.2 (*)    RDW 19.4 (*)    Platelets  577 (*)    All other components within normal limits  BASIC METABOLIC PANEL - Abnormal; Notable for the following:    Glucose, Bld 109 (*)    All other components within normal limits  RETICULOCYTES - Abnormal; Notable for the following:    Retic Ct Pct 11.9 (*)    RBC. 2.57 (*)    Retic Count, Manual 305.8 (*)    All other components within normal limits  HEPATIC FUNCTION PANEL - Abnormal; Notable for the following:    AST 41 (*)    Total Bilirubin 1.8 (*)    Bilirubin, Direct 0.4 (*)    Indirect Bilirubin 1.4 (*)    All other components within normal limits  DIFFERENTIAL    LIPASE, BLOOD  URINALYSIS, ROUTINE W REFLEX MICROSCOPIC   Dg Chest 2 View  12/25/2011  *RADIOLOGY REPORT*  Clinical Data: Sickle cell crisis, generalized pain  CHEST - 2 VIEW  Comparison: 10/06/2011  Findings: Upper-normal size of cardiac silhouette. Slight pulmonary vascular congestion. Mediastinal contours normal. Lungs clear. No pleural effusion or pneumothorax. No acute osseous findings.  IMPRESSION: No acute abnormalities.  Original Report Authenticated By: Lollie Marrow, M.D.     1. Sickle cell pain crisis       MDM  Patient was evaluated by myself. Based on his symptoms patient had workup with abdominal labs as well as evaluation for his sickle cell pain crisis. Chest x-ray was unremarkable. Patient did not have a leukocytosis. Hemoglobin was 9.3 and stable with prior values. Patient did not have significant dehydration. Also liver function was not significantly changed compared to prior. Patient had appropriately elevated reticulocyte count given symptoms. Patient is normally treated with Dilaudid 2 mg, Zofran, and Benadryl. He has previously been admitted to the CDU protocol with resolution of his symptoms. The patient was tachycardic initially he was within normal range for heart rate on my evaluation. Patient will be transferred to CDU for CDU protocol. Patient was comfortable with plan. CDU orders were entered and report was given to the CDU PA.        Cyndra Numbers, MD 12/25/11 2335

## 2011-12-25 NOTE — ED Provider Notes (Signed)
CDU admission note  26 year old male with history of sickle cell disease presents with sickle cell pain crisis. Symptoms include bilateral lower extremity discomfort, lower back pain, diffuse abdominal pain, and nausea consistent with prior exacerbations. Symptoms improving after initial negative workup and first dose of medications. Patient had unremarkable laboratory workup for abdominal pain as well as a negative chest x-ray.  CV: Regular rate and rhythm, no murmurs rubs or gallops Pulmonary: Clear to auscultation bilaterally, no crackles wheezes or rales GI: Soft diffuse discomfort on palpation no focal tenderness, no rebound or guarding, positive bowel sounds  Plan: Admit to CDU protocol overnight for pain control. Will disposition based on response to therapy overnight. Orders entered and report given to PA.  Cyndra Numbers, MD 12/25/11 2337

## 2011-12-25 NOTE — ED Notes (Signed)
Pain med given 

## 2011-12-25 NOTE — ED Notes (Signed)
Iv team called to  Start iv .  He has multiple bruising over both his arms from labs and ivs

## 2011-12-25 NOTE — ED Notes (Signed)
The pt has had pain all over his body since Monday with nausea.  He sees a doctor in [place].  He has had recent ivs and blood draws...  No porta-cath or hickman

## 2011-12-26 MED ORDER — HYDROMORPHONE HCL PF 2 MG/ML IJ SOLN
2.0000 mg | INTRAMUSCULAR | Status: DC | PRN
  Administered 2011-12-26: 2 mg via INTRAVENOUS

## 2011-12-26 MED ORDER — OXYCODONE-ACETAMINOPHEN 5-325 MG PO TABS
2.0000 | ORAL_TABLET | Freq: Once | ORAL | Status: AC
  Administered 2011-12-26: 2 via ORAL
  Filled 2011-12-26: qty 2

## 2011-12-26 MED ORDER — HYDROMORPHONE HCL PF 2 MG/ML IJ SOLN
INTRAMUSCULAR | Status: AC
  Filled 2011-12-26: qty 1

## 2011-12-26 MED ORDER — HYDROMORPHONE HCL PF 2 MG/ML IJ SOLN
2.0000 mg | Freq: Once | INTRAMUSCULAR | Status: AC
  Administered 2011-12-26: 2 mg via INTRAVENOUS
  Filled 2011-12-26: qty 1

## 2011-12-26 NOTE — ED Provider Notes (Signed)
7:40 AM Assumed care of patient in the CDU.  Patient is currently on Sickle Cell Protocol.  Patient came in with back pain and lower extremity pain, which is his typical sickle cell pain.  Patient reports that his pain has moderately improved at this time, but is not back to baseline.  .  Patient alert and orientated x 3, VSS, Heart RRR, Lungs CTAB, Abdomen soft and nontender.    9:51 AM Reassessed patient.  Patient reports that his pain has improved.  Plan is to transition to po pain medications and reassess. 11:12 AM Reassessed patient.  He reports that the po pain medication did not help.  Will start patient back on IV Dilaudid and reassess.  Patient does not feel that he will need to be admitted. 1:30 PM Reassessed patient.  Patient feels that his pain is tolerable at this time and feels that he can be discharged home.  Will discharge patient home and instruct him to follow up with PCP.  Patient has pain medication at home.  Pascal Lux Norman, PA-C 12/26/11 1758

## 2011-12-26 NOTE — ED Notes (Signed)
Transitioning pt to PO medications. Food and entertainment offered but declined.

## 2011-12-26 NOTE — Discharge Instructions (Signed)
Sickle Cell Pain Crisis Sickle cell anemia requires regular medical attention by your healthcare provider and awareness about when to seek medical care. Pain is a common problem in children with sickle cell disease. This usually starts at less than 26 year of age. Pain can occur nearly anywhere in the body but most commonly occurs in the extremities, back, chest, or belly (abdomen). Pain episodes can start suddenly or may follow an illness. These attacks can appear as decreased activity, loss of appetite, change in behavior, or simply complaints of pain. DIAGNOSIS   Specialized blood and gene testing can help make this diagnosis early in the disease. Blood tests may then be done to watch blood levels.   Specialized brain scans are done when there are problems in the brain during a crisis.   Lung testing may be done later in the disease.  HOME CARE INSTRUCTIONS   Maintain good hydration. Increase you or your child's fluid intake in hot weather and during exercise.   Avoid smoking. Smoking lowers the oxygen in the blood and can cause the production of sickle-shaped cells (sickling).   Control pain. Only take over-the-counter or prescription medicines for pain, discomfort, or fever as directed by your caregiver. Do not give aspirin to children because of the association with Reye's syndrome.   Keep regular health care checks to keep a proper red blood cell (hemoglobin) level. A moderate anemia level protects against sickling crises.   You and your child should receive all the same immunizations and care as the people around you.   Mothers should breastfeed their babies if possible. Use formulas with iron added if breastfeeding is not possible. Additional iron should not be given unless there is a lack of it. People with sickle cell disease (SCD) build up iron faster than normal. Give folic acid and additional vitamins as directed.   If you or your child has been prescribed antibiotics or other  medications to prevent problems, take them as directed.   Summer camps are available for children with SCD. They may help young people deal with their disease. The camps introduce them to other children with the same problem.   Young people with SCD may become frustrated or angry at their disease. This can cause rebellion and refusal to follow medical care. Help groups or counseling may help with these problems.   Wear a medical alert bracelet. When traveling, keep medical information, caregiver's names, and the medications you or your child takes with you at all times.  SEEK IMMEDIATE MEDICAL CARE IF:   You or your child develops dizziness or fainting, numbness in or difficulty with movement of arms and legs, difficulty with speech, or is acting abnormally. This could be early signs of a stroke. Immediate treatment is necessary.   You or your child has an oral temperature above 102 F (38.9 C), not controlled by medicine.   You or your child has other signs of infection (chills, lethargy, irritability, poor eating, vomiting). The younger the child, the more you should be concerned.   With fevers, do not give medicine to lower the fever right away. This could cover up a problem that is developing. Notify your caregiver.   You or your child develops pain that is not helped with medicine.   You or your child develops shortness of breath or is coughing up pus-like or bloody sputum.   You or your child develops any problems that are new and are causing you to worry.   You or   your child develops a persistent, often uncomfortable and painful penile erection. This is called priapism. Always check young boys for this. It is often embarrassing for them and they may not bring it to your attention. This is a medical emergency and needs immediate treatment. If this is not treated it will lead to impotence.   You or your child develops a new onset of abdominal pain, especially on the left side near the  stomach area.   You or your child has any questions or has problems that are not getting better. Return immediately if you feel your child is getting worse, even if your child was seen only a short while ago.  Document Released: 07/16/2005 Document Revised: 09/25/2011 Document Reviewed: 12/05/2009 [employer] Patient Information 2012 [employer]. 

## 2011-12-26 NOTE — ED Notes (Signed)
Pt states not wanting to be admitted but pain is not yet at baseline. PA aware.

## 2011-12-26 NOTE — ED Notes (Signed)
Meal tray ordered 

## 2011-12-26 NOTE — ED Notes (Signed)
Pt appears in no acute distress. Current plan to discharge pt in <6 hours.

## 2011-12-26 NOTE — Progress Notes (Signed)
Observation review is complete. 

## 2011-12-27 NOTE — ED Provider Notes (Signed)
Medical screening examination/treatment/procedure(s) were performed by non-physician practitioner and as supervising physician I was immediately available for consultation/collaboration.   Konstantina Nachreiner, MD 12/27/11 1549 

## 2011-12-28 ENCOUNTER — Emergency Department (HOSPITAL_COMMUNITY)
Admission: EM | Admit: 2011-12-28 | Discharge: 2011-12-28 | Disposition: A | Discharge status: Home / Self Care | Payer: PRIVATE HEALTH INSURANCE | Attending: Emergency Medicine | Admitting: Emergency Medicine

## 2011-12-28 ENCOUNTER — Other Ambulatory Visit: Payer: Self-pay

## 2011-12-28 ENCOUNTER — Encounter (HOSPITAL_COMMUNITY): Payer: Self-pay | Admitting: Emergency Medicine

## 2011-12-28 ENCOUNTER — Emergency Department (HOSPITAL_COMMUNITY): Payer: PRIVATE HEALTH INSURANCE

## 2011-12-28 DIAGNOSIS — M545 Low back pain, unspecified: Secondary | ICD-10-CM | POA: Insufficient documentation

## 2011-12-28 DIAGNOSIS — R079 Chest pain, unspecified: Secondary | ICD-10-CM | POA: Insufficient documentation

## 2011-12-28 DIAGNOSIS — Z79899 Other long term (current) drug therapy: Secondary | ICD-10-CM | POA: Insufficient documentation

## 2011-12-28 DIAGNOSIS — R110 Nausea: Secondary | ICD-10-CM | POA: Insufficient documentation

## 2011-12-28 DIAGNOSIS — D5700 Hb-SS disease with crisis, unspecified: Secondary | ICD-10-CM | POA: Insufficient documentation

## 2011-12-28 DIAGNOSIS — M79609 Pain in unspecified limb: Secondary | ICD-10-CM | POA: Insufficient documentation

## 2011-12-28 LAB — DIFFERENTIAL
Basophils Absolute: 0 10*3/uL (ref 0.0–0.1)
Basophils Relative: 0 % (ref 0–1)
Eosinophils Relative: 0 % (ref 0–5)
Monocytes Absolute: 0.7 10*3/uL (ref 0.1–1.0)
Monocytes Relative: 9 % (ref 3–12)
Neutro Abs: 6.3 10*3/uL (ref 1.7–7.7)

## 2011-12-28 LAB — BASIC METABOLIC PANEL
BUN: 7 mg/dL (ref 6–23)
CO2: 24 mEq/L (ref 19–32)
Calcium: 8.7 mg/dL (ref 8.4–10.5)
Chloride: 105 mEq/L (ref 96–112)
Creatinine, Ser: 0.56 mg/dL (ref 0.50–1.35)
GFR calc Af Amer: >90 mL/min (ref >90)

## 2011-12-28 LAB — CBC
HCT: 23.7 % — ABNORMAL LOW (ref 39.0–52.0)
Hemoglobin: 8.5 g/dL — ABNORMAL LOW (ref 13.0–17.0)
MCHC: 35.9 g/dL (ref 30.0–36.0)
MCV: 103.9 fL — ABNORMAL HIGH (ref 78.0–100.0)
RDW: 19.7 % — ABNORMAL HIGH (ref 11.5–15.5)

## 2011-12-28 MED ORDER — SODIUM CHLORIDE 0.9 % IV BOLUS (SEPSIS)
1000.0000 mL | Freq: Once | INTRAVENOUS | Status: AC
  Administered 2011-12-28: 1000 mL via INTRAVENOUS

## 2011-12-28 MED ORDER — DIPHENHYDRAMINE HCL 50 MG/ML IJ SOLN
12.5000 mg | Freq: Once | INTRAMUSCULAR | Status: AC
  Administered 2011-12-28: 12.5 mg via INTRAVENOUS
  Filled 2011-12-28: qty 1

## 2011-12-28 MED ORDER — DIPHENHYDRAMINE HCL 50 MG/ML IJ SOLN
12.5000 mg | Freq: Once | INTRAMUSCULAR | Status: AC
  Administered 2011-12-28: 12.5 mg via INTRAVENOUS

## 2011-12-28 MED ORDER — HYDROMORPHONE HCL PF 1 MG/ML IJ SOLN
1.0000 mg | Freq: Once | INTRAMUSCULAR | Status: AC
  Administered 2011-12-28: 1 mg via INTRAVENOUS
  Filled 2011-12-28: qty 1

## 2011-12-28 MED ORDER — ONDANSETRON HCL 4 MG/2ML IJ SOLN
4.0000 mg | Freq: Once | INTRAMUSCULAR | Status: AC
  Administered 2011-12-28: 4 mg via INTRAVENOUS
  Filled 2011-12-28: qty 2

## 2011-12-28 MED ORDER — HYDROMORPHONE HCL PF 2 MG/ML IJ SOLN
2.0000 mg | Freq: Once | INTRAMUSCULAR | Status: AC
  Administered 2011-12-28: 2 mg via INTRAVENOUS
  Filled 2011-12-28: qty 1

## 2011-12-28 MED ORDER — HYDROMORPHONE HCL PF 1 MG/ML IJ SOLN
2.0000 mg | Freq: Once | INTRAMUSCULAR | Status: AC
  Administered 2011-12-28: 2 mg via INTRAVENOUS
  Filled 2011-12-28 (×2): qty 2

## 2011-12-28 MED ORDER — SODIUM CHLORIDE 0.9 % IV SOLN
INTRAVENOUS | Status: DC
  Administered 2011-12-28: 15:00:00 via INTRAVENOUS

## 2011-12-28 MED ORDER — DIPHENHYDRAMINE HCL 50 MG/ML IJ SOLN
25.0000 mg | Freq: Once | INTRAMUSCULAR | Status: DC
  Filled 2011-12-28: qty 1

## 2011-12-28 MED ORDER — HYDROMORPHONE HCL PF 1 MG/ML IJ SOLN: 1.0000 mg | INTRAMUSCULAR | Status: DC | PRN

## 2011-12-28 MED ORDER — DIPHENHYDRAMINE HCL 50 MG/ML IJ SOLN
12.5000 mg | Freq: Once | INTRAMUSCULAR | Status: AC
Start: 2011-12-28 — End: 2011-12-28
  Administered 2011-12-28: 12.5 mg via INTRAVENOUS
  Filled 2011-12-28: qty 1

## 2011-12-28 MED ORDER — HYDROMORPHONE HCL PF 2 MG/ML IJ SOLN
2.0000 mg | INTRAMUSCULAR | Status: DC | PRN
  Administered 2011-12-28 (×2): 2 mg via INTRAVENOUS
  Filled 2011-12-28 (×2): qty 1

## 2011-12-28 NOTE — ED Provider Notes (Signed)
Pt with hx sickle cell anemia, stating feels is having a typical pain crisis for home. C/o back pain, joint pain. No cp or sob. No fever or chills. Dilaudid iv.  Recheck pt appears comfortable, states pain persists. Has been moved to cdu for obs/pain control. Signed out to CDU PA and Dr Hyman Hopes at 1530.   Suzi Roots, MD 12/28/11 [phone]

## 2011-12-28 NOTE — ED Notes (Signed)
Dinner tray ordered. Regular diet. 

## 2011-12-28 NOTE — ED Notes (Signed)
Blood drawn by IV team at bedside.

## 2011-12-28 NOTE — ED Provider Notes (Signed)
History     CSN: [id number]  Arrival date & time 12/28/11  1109   First MD Initiated Contact with Patient 12/28/11 1210      Chief Complaint  Patient presents with  . Sickle Cell Pain Crisis  . Chest Pain    (Consider location/radiation/quality/duration/timing/severity/associated sxs/prior treatment) HPI  Past Medical History  Diagnosis Date  . Sickle cell anemia     Past Surgical History  Procedure Date  . Appendectomy     History reviewed. No pertinent family history.  History  Substance Use Topics  . Smoking status: Never Smoker   . Smokeless tobacco: Not on file  . Alcohol Use: No      Review of Systems  Allergies  Sulfa antibiotics  Home Medications   Current Outpatient Rx  Name Route Sig Dispense Refill  . FOLIC ACID 1 MG PO TABS Oral Take 1 mg by mouth daily.      Marland Kitchen HYDROXYUREA 500 MG PO CAPS Oral Take 1,000 mg by mouth daily. May take with food to minimize GI side effects.     . OXYCODONE HCL 5 MG PO CAPS Oral Take 10 mg by mouth every 4 (four) hours as needed. For pain      BP 130/75  Pulse 97  Temp(Src) 98.1 F (36.7 C) (Oral)  Resp 18  SpO2 97%  Physical Exam  ED Course  Procedures (including critical care time)  Labs Reviewed  CBC - Abnormal; Notable for the following:    RBC 2.28 (*)    Hemoglobin 8.5 (*)    HCT 23.7 (*)    MCV 103.9 (*)    MCH 37.3 (*)    RDW 19.7 (*)    Platelets 517 (*)    All other components within normal limits  DIFFERENTIAL - Abnormal; Notable for the following:    Neutrophils Relative 78 (*)    All other components within normal limits  RETICULOCYTES - Abnormal; Notable for the following:    Retic Ct Pct 13.6 (*)    RBC. 2.28 (*)    Retic Count, Manual 310.1 (*)    All other components within normal limits  BASIC METABOLIC PANEL   Dg Chest 2 View  12/28/2011  *RADIOLOGY REPORT*  Clinical Data: Chest pain.  CHEST - 2 VIEW  Comparison: 12/25/2011.  Findings: The cardiac silhouette, mediastinal  and hilar contours are within normal limits and stable. The lungs are clear.  No pleural effusions.  The bony thorax is intact  IMPRESSION: Normal chest x-ray.  Original Report Authenticated By: P. Loralie Champagne, M.D.     1. Vaso-occlusive sickle cell crisis     3:27 PM Handoff from [facility], pt on sickle cell protocol with typical sickle cell pain. CXR neg. Plan: d/c when pain is controlled.   5:39 PM Patient seen and examined.   Vital signs reviewed and are as follows: Filed Vitals:   12/28/11 1716  BP: 112/74  Pulse:   Temp:   Resp: 18   BP 112/74  Pulse 97  Temp(Src) 98.1 F (36.7 C) (Oral)  Resp 18  SpO2 100%  5:39 PM Exam:  Gen NAD; Heart RRR, nml S1,S2, no m/r/g; Lungs CTAB; Abd soft, NT, no rebound or guarding; Ext 2+ pedal pulses bilaterally, no edema.  Patient states pain is typical of his usual flares. Patient takes oxycodone 10 mg at home when necessary for pain. Informed of negative x-ray results. Will give additional dose of pain medication. Patient states he is feeling better.  He believes that he will be improved enough for discharge. Will monitor. He is eating in room.  7:48 PM Additional dose of pain medicine given. Patient states pain is improving.   9:22 PM Pt examined. Will give another dose of pain medication.   10:29 PM patient states he is feeling improved. He states that he is ready for discharge and is requesting discharge home. Urged patient to return with worsening symptoms, trouble breathing, pain that is atypical for him, fever, or any other concerns. Patient verbalizes understanding and agrees with this plan. Urged patient to follow up with primary care physician in the next 3 days.  MDM  Vaso-occlusive sickle cell crisis. No evidence of aplastic crisis. No evidence of acute chest. Patient is clinically improved in the emergency department with fluids and pain medication. Patient is stable for discharge home. He has primary care  followup.        Renne Crigler, [place] 12/28/11 2230

## 2011-12-28 NOTE — ED Notes (Signed)
O2 applied when pt arrived to CDU.

## 2011-12-28 NOTE — ED Provider Notes (Signed)
History     CSN: [id number]  Arrival date & time 12/28/11  1109   First MD Initiated Contact with Patient 12/28/11 1210      Chief Complaint  Patient presents with  . Sickle Cell Pain Crisis  . Chest Pain    (Consider location/radiation/quality/duration/timing/severity/associated sxs/prior treatment) HPI Comments: Patient with a PMH significant for Sickle Cell Disease.  Patient comes in today with a chief complaint of lower back pain, pain of his lower extremities, and nausea.  He reports that this pain is the pain that he typically gets with a sickle cell crisis.  He tried taking oxycodone for the pain, but does not feel that it helped.  He reports minimal chest pain.  He denies any shortness of breath.  Denies vomiting.  Denies any recent illness or fever.  He is unsure what triggered the pain.  He was seen in the Emergency Department two days for the same complaint.  He was discharged home after pain had improved.  He reports that the physician that he sees for his Sickle Cell is Dr. Oretha Milch.    Patient is a 26 y.o. male presenting with sickle cell pain and chest pain. The history is provided by the patient.  Sickle Cell Pain Crisis  Associated symptoms include chest pain and back pain. Pertinent negatives include no abdominal pain, no nausea, no vomiting and no neck pain.  Chest Pain Pertinent negatives for primary symptoms include no fever, no shortness of breath, no abdominal pain, no nausea, no vomiting and no dizziness.  Pertinent negatives for associated symptoms include no diaphoresis.     Past Medical History  Diagnosis Date  . Sickle cell anemia     Past Surgical History  Procedure Date  . Appendectomy     History reviewed. No pertinent family history.  History  Substance Use Topics  . Smoking status: Never Smoker   . Smokeless tobacco: Not on file  . Alcohol Use: No      Review of Systems  Constitutional: Negative for fever, chills and  diaphoresis.  HENT: Negative for neck pain and neck stiffness.   Respiratory: Negative for shortness of breath.   Cardiovascular: Positive for chest pain.  Gastrointestinal: Negative for nausea, vomiting and abdominal pain.  Musculoskeletal: Positive for back pain. Negative for joint swelling.  Neurological: Negative for dizziness, syncope and light-headedness.  Psychiatric/Behavioral: Negative for confusion.    Allergies  Sulfa antibiotics  Home Medications   Current Outpatient Rx  Name Route Sig Dispense Refill  . FOLIC ACID 1 MG PO TABS Oral Take 1 mg by mouth daily.      Marland Kitchen HYDROXYUREA 500 MG PO CAPS Oral Take 1,000 mg by mouth daily. May take with food to minimize GI side effects.     . OXYCODONE HCL 5 MG PO CAPS Oral Take 10 mg by mouth every 4 (four) hours as needed. For pain      BP 130/75  Pulse 107  Temp(Src) 99.1 F (37.3 C) (Oral)  Resp 20  SpO2 98%  Physical Exam  Nursing note and vitals reviewed. Constitutional: He is oriented to person, place, and time. He appears well-developed and well-nourished.  HENT:  Head: Normocephalic and atraumatic.  Neck: Normal range of motion. Neck supple.  Cardiovascular: Normal rate, regular rhythm and normal heart sounds.   Pulmonary/Chest: Effort normal and breath sounds normal. No respiratory distress. He has no wheezes.  Abdominal: Soft. Bowel sounds are normal. He exhibits no distension and no mass. There  is no tenderness. There is no rebound and no guarding.  Musculoskeletal: Normal range of motion. He exhibits no edema.  Neurological: He is alert and oriented to person, place, and time.  Skin: Skin is warm and dry. No rash noted.  Psychiatric: He has a normal mood and affect.    ED Course  Procedures (including critical care time)   Labs Reviewed  CBC  DIFFERENTIAL  BASIC METABOLIC PANEL  RETICULOCYTES   No results found.   No diagnosis found.  Patient discussed with Dr. Denton Lank.  2:24 PM Reassessed  patient.  Patient not in any acute distress.  Patient reports that his pain has improved somewhat, but is not at his baseline.  He currently rates pain as a 8/10.  Patient will be moved to the CDU and placed on Sickle Cell Protocol.  Patient discussed with Alejandro Mulling, PA-C   Date: 12/28/2011  Rate: 96  Rhythm: normal sinus rhythm  QRS Axis: normal  Intervals: normal  ST/T Wave abnormalities: t wave inversion in lead III, unchanged from previous EKG  Conduction Disutrbances:none  Narrative Interpretation:   Old EKG Reviewed: unchanged    MDM  Patient with pain that he typically has with his sickle cell pain.  Patient placed on Sickle Cell Protocol.  Plan is for patient to be given IV pain medication and IVF and discharged home once his pain has improved.        Pascal Lux [place], PA-C 12/28/11 1516  Pascal Lux [place], PA-C 12/28/11 1558

## 2011-12-28 NOTE — ED Notes (Signed)
Pt reports here for sickle cell pain in chest onset yesterday.

## 2011-12-28 NOTE — Discharge Instructions (Signed)
Please read and follow all provided instructions.  Your diagnoses today include:  1. Vaso-occlusive sickle cell crisis     Tests performed today include:  Chest x-ray that did not show any lung infection  Blood tests  Vital signs. See below for your results today.   Medications prescribed:   None  Home care instructions:  Follow any educational materials contained in this packet. Use your home pain medications as prescribed by your doctor.  Follow-up instructions: Please follow-up with your primary care provider in the next 3 days for further evaluation of your symptoms. If you do not have a primary care doctor -- see below for referral information.   Return instructions:   Please return to the Emergency Department if you experience worsening symptoms, fever, chest pain, shortness of breath, pain that is not the same as your usual pain.  Please return if you have any other emergent concerns.  Additional Information:  Your vital signs today were: BP 110/76  Pulse 98  Temp(Src) 98.7 F (37.1 C) (Oral)  Resp 16  SpO2 100% If your blood pressure (BP) was elevated above 135/85 this visit, please have this repeated by your doctor within one month. -------------- No Primary Care Doctor Call [facility]  [phone] Other agencies that provide inexpensive medical care    [facility]  [phone]    [facility]  [phone]    [facility]  [phone]    [facility]  [phone]    [facility]  [phone]    [facility]  [phone] -------------- RESOURCE GUIDE:  Dental Problems  Patients with Medicaid: [facility] [phone]                                            [phone] [address] Phone:  [phone]                                                   Phone:  [phone]  If unable to pay or uninsured, contact:  [facility] or [facility]. to  become qualified for the adult dental clinic.  Chronic Pain Problems Contact [facility]  [phone] Patients need to be referred by their primary care doctor.  Insufficient Money for Medicine Contact [employer]:  call "211" or [facility] [phone].  Psychological Services [facility]  [phone] [facility]  [phone] [facility]   [phone] (emergency services [phone])  Substance Abuse Resources [facility]  [phone] [facility] [phone] [facility] [place] [phone] Floydene Flock [phone] Residential & Outpatient Substance Abuse Program  [phone]  Abuse/Neglect [facility] Child Abuse Hotline [phone] [facility] Child Abuse Hotline (440)814-0373 (After Hours)  Emergency Shelter [facility] [phone]  Maternity Homes [facility] at the [place] [facility] [phone] [place] [facility] [phone]  [facility]  [facility] [address] [place]                       [address]      [address]  [place]                                                [place]                            [place] Phone:  [phone]                                   Phone:  [phone]                 Phone:  [phone]  [place] [facility] Phone:  [phone]  [place] Child Abuse Hotline [phone]) 342-1394 [phone] (After Hours)    

## 2011-12-28 NOTE — ED Notes (Signed)
Unable to gain iv access, iv team paged, pt to xray

## 2011-12-29 NOTE — ED Provider Notes (Signed)
Medical screening examination/treatment/procedure(s) were conducted as a shared visit with non-physician practitioner(s) and myself.  I personally evaluated the patient during the encounter   Suzi Roots, MD 12/29/11 1104

## 2011-12-29 NOTE — ED Provider Notes (Signed)
Medical screening examination/treatment/procedure(s) were conducted as a shared visit with non-physician practitioner(s) and myself.  I personally evaluated the patient during the encounter   Suzi Roots, MD 12/29/11 1108

## 2012-01-14 ENCOUNTER — Emergency Department (HOSPITAL_COMMUNITY)
Admission: EM | Admit: 2012-01-14 | Discharge: 2012-01-14 | Disposition: A | Discharge status: Home / Self Care | Payer: PRIVATE HEALTH INSURANCE | Source: Home / Self Care | Attending: Emergency Medicine | Admitting: Emergency Medicine

## 2012-01-14 ENCOUNTER — Other Ambulatory Visit: Payer: Self-pay

## 2012-01-14 ENCOUNTER — Non-Acute Institutional Stay (HOSPITAL_COMMUNITY)
Admission: AD | Admit: 2012-01-14 | Discharge: 2012-01-14 | Disposition: A | Discharge status: Home / Self Care | Payer: PRIVATE HEALTH INSURANCE | Attending: Internal Medicine | Admitting: Internal Medicine

## 2012-01-14 ENCOUNTER — Encounter (HOSPITAL_COMMUNITY): Payer: Self-pay

## 2012-01-14 DIAGNOSIS — D5700 Hb-SS disease with crisis, unspecified: Secondary | ICD-10-CM | POA: Insufficient documentation

## 2012-01-14 DIAGNOSIS — F430 Acute stress reaction: Secondary | ICD-10-CM | POA: Insufficient documentation

## 2012-01-14 DIAGNOSIS — Z79899 Other long term (current) drug therapy: Secondary | ICD-10-CM | POA: Insufficient documentation

## 2012-01-14 LAB — COMPREHENSIVE METABOLIC PANEL
CO2: 28 mEq/L (ref 19–32)
Calcium: 9.3 mg/dL (ref 8.4–10.5)
Chloride: 105 mEq/L (ref 96–112)
Creatinine, Ser: 0.5 mg/dL (ref 0.50–1.35)
GFR calc Af Amer: >90 mL/min (ref >90)
GFR calc non Af Amer: >90 mL/min (ref >90)
Glucose, Bld: 126 mg/dL — ABNORMAL HIGH (ref 70–99)
Total Bilirubin: 0.9 mg/dL (ref 0.3–1.2)

## 2012-01-14 LAB — DIFFERENTIAL
Eosinophils Relative: 0 % (ref 0–5)
Lymphocytes Relative: 11 % — ABNORMAL LOW (ref 12–46)
Lymphs Abs: 0.8 10*3/uL (ref 0.7–4.0)
Monocytes Absolute: 0.4 10*3/uL (ref 0.1–1.0)
Monocytes Relative: 5 % (ref 3–12)
Neutro Abs: 6.1 10*3/uL (ref 1.7–7.7)

## 2012-01-14 LAB — CBC
HCT: 26 % — ABNORMAL LOW (ref 39.0–52.0)
Hemoglobin: 8.9 g/dL — ABNORMAL LOW (ref 13.0–17.0)
MCV: 102.8 fL — ABNORMAL HIGH (ref 78.0–100.0)
RBC: 2.53 MIL/uL — ABNORMAL LOW (ref 4.22–5.81)
WBC: 7.3 10*3/uL (ref 4.0–10.5)

## 2012-01-14 MED ORDER — HYDROMORPHONE HCL PF 2 MG/ML IJ SOLN
4.0000 mg | Freq: Once | INTRAMUSCULAR | Status: AC
  Administered 2012-01-14: 4 mg via INTRAVENOUS
  Filled 2012-01-14: qty 2

## 2012-01-14 MED ORDER — DEXTROSE-NACL 5-0.45 % IV SOLN
INTRAVENOUS | Status: DC
  Administered 2012-01-14: 12:00:00 via INTRAVENOUS
  Filled 2012-01-14: qty 1000

## 2012-01-14 MED ORDER — DIPHENHYDRAMINE HCL 12.5 MG/5ML PO ELIX: 12.5000 mg | ORAL_SOLUTION | Freq: Four times a day (QID) | ORAL | Status: DC | PRN

## 2012-01-14 MED ORDER — ONDANSETRON HCL 4 MG/2ML IJ SOLN
4.0000 mg | INTRAMUSCULAR | Status: DC | PRN
  Administered 2012-01-14: 4 mg via INTRAVENOUS
  Filled 2012-01-14: qty 2

## 2012-01-14 MED ORDER — SODIUM CHLORIDE 0.9 % IJ SOLN: 9.0000 mL | INTRAMUSCULAR | Status: DC | PRN

## 2012-01-14 MED ORDER — HYDROMORPHONE HCL PF 2 MG/ML IJ SOLN
2.0000 mg | Freq: Once | INTRAMUSCULAR | Status: AC
  Administered 2012-01-14: 2 mg via INTRAVENOUS
  Filled 2012-01-14: qty 1

## 2012-01-14 MED ORDER — POTASSIUM CHLORIDE CRYS ER 20 MEQ PO TBCR
20.0000 meq | EXTENDED_RELEASE_TABLET | Freq: Two times a day (BID) | ORAL | Status: DC
  Administered 2012-01-14: 20 meq via ORAL
  Filled 2012-01-14: qty 1

## 2012-01-14 MED ORDER — ONDANSETRON HCL 8 MG PO TABS: 4.0000 mg | ORAL_TABLET | ORAL | Status: DC | PRN

## 2012-01-14 MED ORDER — FOLIC ACID 1 MG PO TABS
1.0000 mg | ORAL_TABLET | Freq: Every day | ORAL | Status: DC
  Administered 2012-01-14: 1 mg via ORAL
  Filled 2012-01-14: qty 1

## 2012-01-14 MED ORDER — NALOXONE HCL 0.4 MG/ML IJ SOLN: 0.4000 mg | INTRAMUSCULAR | Status: DC | PRN

## 2012-01-14 MED ORDER — HYDROMORPHONE 0.3 MG/ML IV SOLN
INTRAVENOUS | Status: DC
  Administered 2012-01-14: 3.49 mg via INTRAVENOUS
  Administered 2012-01-14 (×2): 0.5 mg via INTRAVENOUS
  Administered 2012-01-14: 13:00:00 via INTRAVENOUS
  Filled 2012-01-14: qty 25

## 2012-01-14 MED ORDER — POTASSIUM CHLORIDE ER 10 MEQ PO TBCR: 20.0000 meq | EXTENDED_RELEASE_TABLET | Freq: Every day | ORAL | Status: AC

## 2012-01-14 MED ORDER — POTASSIUM CHLORIDE ER 10 MEQ PO TBCR: 20.0000 meq | EXTENDED_RELEASE_TABLET | Freq: Two times a day (BID) | ORAL | Status: DC

## 2012-01-14 MED ORDER — DIPHENHYDRAMINE HCL 50 MG/ML IJ SOLN
12.5000 mg | Freq: Four times a day (QID) | INTRAMUSCULAR | Status: DC | PRN
  Administered 2012-01-14: 12.5 mg via INTRAVENOUS
  Filled 2012-01-14: qty 1

## 2012-01-14 NOTE — ED Notes (Signed)
Patient has hx of sickle cell and states that since Monday he has been getting worse. He states that he has been having high fevers at home. Oxycodone pta at 0600 with no relief. Pt describes the pain as generalized and severe at 10/10. Breath sounds are clear and bowel sounds are present. Pt was just dropped off by his family.

## 2012-01-14 NOTE — Discharge Summary (Signed)
Physician Discharge Summary  Patient ID: Justin Sharp MRN: [id number] DOB/AGE: 26/31/1987 25 y.o.  Admit date: 01/14/2012 Discharge date: 01/14/2012   Discharge Diagnoses:  Sickle cell crisis Hemoglobin SS disease Situational stress  Discharged Condition: improved  Operations/Procedues: IV access.   Sickle Cell Unit Course: patient was started on IV fluids of half-normal saline at 125 cc an hour. He was given a bolus of Dilaudid at 4 mg IV acutely with marked improvement of his pain. Patient was thereafter placed on a PCA Dilaudid pump which he had tolerated well at [facility] laboratory data was remarkable for a hemoglobin 8.9. His potassium was low at 3.1. He was started on replacement for this as well. After several hours his pain decreased to a 6/10. He felt that he could manage his pain at home with his present oral medications. He was encouraged to return to the unit if he needed further therapy.  During his unit stay patient was seen by our clinical social worker as well. Patient acknowledges he had been under increased stress associated with school as well as pending marriage. Coping strategies were reviewed.   Significant Diagnostic Studies: none  Disposition: 01-Home or Self Care  Discharge Orders    Future Orders Please Complete By Expires   Diet - low sodium heart healthy      Increase activity slowly      No wound care      Call MD for:  severe uncontrolled pain      Discharge instructions      Comments:   Follow up with primary care physician in two weeks.     Medication List  As of 01/14/2012 10:50 PM   TAKE these medications         folic acid 1 MG tablet   Commonly known as: FOLVITE   Take 1 mg by mouth daily.      HYDROmorphone 8 MG tablet   Commonly known as: DILAUDID   Take 8 mg by mouth every 4 (four) hours as needed.      hydroxyurea 500 MG capsule   Commonly known as: HYDREA   Take 1,000 mg by mouth daily. May take with food to minimize GI side  effects.      mulitivitamin with minerals Tabs   Take 1 tablet by mouth daily.      oxycodone 5 MG capsule   Commonly known as: OXY-IR   Take 10 mg by mouth every 4 (four) hours as needed. For pain      potassium chloride 10 MEQ tablet   Commonly known as: K-DUR   Take 2 tablets (20 mEq total) by mouth daily.           Follow-up Information    Follow up with HALPERN,DAVID, MD. Schedule an appointment as soon as possible for a visit in 2 weeks. (call physician prior to appointment, if symptoms worsen)          Signed: Tyrrell Stephens 01/14/2012, 10:50 PM

## 2012-01-14 NOTE — H&P (Signed)
Justin Sharp is an 26 y.o. male.   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 He had been doing well recently while attending school. He acknowledges recent increased stress associated with school and pending marriage. He has had pain diffusely at this point which he rates as a 10 out of 10. He denies significant headaches sore throat or cough. The patient is followed in term by Dr. Sharen Counter. His hemoglobin normally runs approximately 10. He had been on an exchange transfusion program in the past. Has a past history of acute chest syndrome 2 years ago as well.  Past Medical History  Diagnosis Date  . Sickle cell anemia     Past Surgical History  Procedure Date  . Appendectomy     No family history on file. Social History:  reports that he has never smoked. He does not have any smokeless tobacco history on file. He reports that he does not drink alcohol or use illicit drugs.  Allergies:  Allergies  Allergen Reactions  . Sulfa Antibiotics Hives  . Sulfa Drugs Cross Reactors Hives    Medications Prior to Admission  Medication Dose Route Frequency Provider Last Rate Last Dose  . dextrose 5 %-0.45 % sodium chloride infusion   Intravenous Continuous Gwenyth Bender, MD      . diphenhydrAMINE (BENADRYL) injection 12.5 mg  12.5 mg Intravenous Q6H PRN Gwenyth Bender, MD       Or  . diphenhydrAMINE (BENADRYL) 12.5 MG/5ML elixir 12.5 mg  12.5 mg Oral Q6H PRN Gwenyth Bender, MD      . folic acid (FOLVITE) tablet 1 mg  1 mg Oral Daily Gwenyth Bender, MD      . HYDROmorphone (DILAUDID) injection 4 mg  4 mg Intravenous Once Gwenyth Bender, MD      . HYDROmorphone (DILAUDID) PCA injection 0.3 mg/mL   Intravenous Q4H Gwenyth Bender, MD      . naloxone [facility]) injection 0.4 mg  0.4 mg Intravenous PRN Gwenyth Bender, MD       And  . sodium chloride 0.9 % injection 9 mL  9 mL Intravenous PRN Gwenyth Bender, MD      . ondansetron [facility]) tablet 4 mg  4 mg Oral Q4H PRN Gwenyth Bender, MD       Or  . ondansetron [facility]) injection 4 mg  4 mg Intravenous Q4H PRN Gwenyth Bender, MD       Medications Prior to Admission  Medication Sig Dispense Refill  . folic acid (FOLVITE) 1 MG tablet Take 1 mg by mouth daily.        . hydroxyurea (HYDREA) 500 MG capsule Take 1,000 mg by mouth daily. May take with food to minimize GI side effects.       Marland Kitchen oxycodone (OXY-IR) 5 MG capsule Take 10 mg by mouth every 4 (four) hours as needed. For pain        No results found for this or any previous visit (from the past 48 hour(s)). No results found.  Review of systems otherwise unremarkable. Blood pressure 114/74, pulse 98, temperature 99.1 F (37.3 C), temperature source Oral, resp. rate 18, SpO2 99.00%. Well-developed well-nourished black male in mild distress. HEENT: No sinus tenderness. Minimal sclera icterus. Posterior pharynx clear. NECK: No enlarged thyroid. No posterior cervical nodes. LUNGS:  Clear to auscultation. No vocal fremitus. No CVA tenderness. CVS: Normal S1, S2 without S3. ABDOMEN: No masses or tenderness. MSK: Negative Homans. Minimal tenderness in the knees. NEUROLOGICAL: Intact  Laboratory data pending.  Assessment/Plan Sickle cell crisis. Hemoglobin SS disease. Situational stress. History of acute chest syndrome. PLAN: Continue IV fluids with D5 half normal saline. Placed on PCA Dilaudid pump. Incentive spirometer. Reassessment end of unit stay for discharge or admission.    Corri Delapaz 01/14/2012, 11:28 AM

## 2012-01-14 NOTE — ED Provider Notes (Signed)
History     CSN: [id number]  Arrival date & time 01/14/12  7829   First MD Initiated Contact with Patient 01/14/12 [phone]      Chief Complaint  Patient presents with  . Sickle Cell Pain Crisis     The history is provided by the patient.   the patient reports he's been having generalized pain typical of his sickle cell pain for several days. He developed a fever on Monday. He is normally seen at the [facility] sickle cell clinic and he has been trying his oxycodone at home without relief in his pain. He's been nauseated but has had no vomiting. He denies diarrhea. He denies chest pain or shortness of breath. He has abdominal pain. There is no flank pain. He denies cough or congestion. Nothing worsens his symptoms. Nothing improves his symptoms. Symptoms are constant.  Past Medical History  Diagnosis Date  . Sickle cell anemia     Past Surgical History  Procedure Date  . Appendectomy     History reviewed. No pertinent family history.  History  Substance Use Topics  . Smoking status: Never Smoker   . Smokeless tobacco: Not on file  . Alcohol Use: No      Review of Systems  All other systems reviewed and are negative.    Allergies  Sulfa antibiotics  Home Medications   Current Outpatient Rx  Name Route Sig Dispense Refill  . FOLIC ACID 1 MG PO TABS Oral Take 1 mg by mouth daily.      Marland Kitchen HYDROXYUREA 500 MG PO CAPS Oral Take 1,000 mg by mouth daily. May take with food to minimize GI side effects.     . OXYCODONE HCL 5 MG PO CAPS Oral Take 10 mg by mouth every 4 (four) hours as needed. For pain      BP 129/68  Pulse 91  Temp(Src) 99 F (37.2 C) (Oral)  Resp 20  SpO2 99%  Physical Exam  Constitutional: He is oriented to person, place, and time. He appears well-developed and well-nourished.  HENT:  Head: Normocephalic.  Eyes: EOM are normal.  Neck: Normal range of motion.  Cardiovascular: Normal rate and regular rhythm.   Pulmonary/Chest: Effort normal and breath  sounds normal. No respiratory distress.  Abdominal: Soft.  Musculoskeletal: Normal range of motion.  Neurological: He is alert and oriented to person, place, and time.  Skin: Skin is warm and dry.  Psychiatric: He has a normal mood and affect.    ED Course  Procedures (including critical care time)   Date: 01/14/2012  Rate: 92  Rhythm: normal sinus rhythm  QRS Axis: normal  Intervals: normal  ST/T Wave abnormalities: normal  Conduction Disutrbances: none  Narrative Interpretation:   Old EKG Reviewed: No significant changes noted     Labs Reviewed - No data to display No results found.   1. Sickle cell anemia with pain       MDM  Pt with sickle cell related complaints. Will attempt to transfer to sickle cell medical clinic for ongoing care.   9:24 AM i spoke with Dr August Saucer who will evaluate the pt in the sickle cell medical clinic. Dc from ER. MSE completed.           Lyanne Co, MD 01/14/12 [phone]

## 2012-01-14 NOTE — Progress Notes (Signed)
Clinical Social Work Department BRIEF PSYCHOSOCIAL ASSESSMENT 01/14/2012  Patient:  Justin Sharp, Justin Sharp     Account Number:  [id number]     Admit date:  01/14/2012  Clinical Social Worker:  Eddie Candle  Date/Time:  01/14/2012 12:00 N  Referred by:  Physician  Date Referred:  01/14/2012 Referred for  Other - See comment   Other Referral:   Support   Interview type:  Patient Other interview type:    PSYCHOSOCIAL DATA Living Status:  ALONE Admitted from facility:   Level of care:   Primary support name:  Parent Primary support relationship to patient:  PARENT Degree of support available:   Good    CURRENT CONCERNS Current Concerns  None Noted   Other Concerns:   Patient stated that he believes that this crisis is likely related to stress of managing the impending transition of graduating from college and getting married in less than 1 month.    SOCIAL WORK ASSESSMENT / PLAN CSW discuss stress levels with patient.  Patient identified that he has been able to manage stress well with a good support system including his family, church and work support.   Assessment/plan status:  No Further Intervention Required Other assessment/ plan:   Information/referral to community resources:   Patient identified that he had no needs at this time but appreciative of the conversation and took CSW's card for future reference.    PATIENT'S/FAMILY'S RESPONSE TO PLAN OF CARE: Patient agreeable to plan of care.   Nathen May MSW, LCSW [phone]

## 2012-01-14 NOTE — ED Notes (Signed)
Dr.campos aware of pt's discomfort. md is attempting to transfer pt to sickle cell clinic at [facility].

## 2012-01-14 NOTE — ED Notes (Signed)
Pt states that since Monday he has been having generalized pain since Monday with high fevers. Pt is normally a patient with [facility]. He states that his fevers have been as high as 101. He states that he has been nauseated but has not vomited. Last crisis was a month or so ago and responded to fluids and meds and was able to be discharged home from the er. Last oxycodone was at 0600 with no relief. Pt states that the pain is generalized.

## 2012-01-15 ENCOUNTER — Non-Acute Institutional Stay (HOSPITAL_COMMUNITY)
Admission: AD | Admit: 2012-01-15 | Discharge: 2012-01-15 | Disposition: A | Discharge status: Home / Self Care | Payer: PRIVATE HEALTH INSURANCE | Attending: Internal Medicine | Admitting: Internal Medicine

## 2012-01-15 ENCOUNTER — Encounter (HOSPITAL_COMMUNITY): Payer: Self-pay | Admitting: *Deleted

## 2012-01-15 DIAGNOSIS — F439 Reaction to severe stress, unspecified: Secondary | ICD-10-CM

## 2012-01-15 DIAGNOSIS — D571 Sickle-cell disease without crisis: Secondary | ICD-10-CM | POA: Insufficient documentation

## 2012-01-15 DIAGNOSIS — M549 Dorsalgia, unspecified: Secondary | ICD-10-CM | POA: Insufficient documentation

## 2012-01-15 DIAGNOSIS — F430 Acute stress reaction: Secondary | ICD-10-CM | POA: Insufficient documentation

## 2012-01-15 DIAGNOSIS — D5700 Hb-SS disease with crisis, unspecified: Secondary | ICD-10-CM | POA: Diagnosis present

## 2012-01-15 DIAGNOSIS — M79609 Pain in unspecified limb: Secondary | ICD-10-CM | POA: Insufficient documentation

## 2012-01-15 LAB — COMPREHENSIVE METABOLIC PANEL
AST: 23 U/L (ref 0–37)
Albumin: 4.4 g/dL (ref 3.5–5.2)
Alkaline Phosphatase: 55 U/L (ref 39–117)
Chloride: 105 mEq/L (ref 96–112)
Creatinine, Ser: 0.55 mg/dL (ref 0.50–1.35)
Potassium: 3.7 mEq/L (ref 3.5–5.1)
Total Bilirubin: 0.9 mg/dL (ref 0.3–1.2)
Total Protein: 8.1 g/dL (ref 6.0–8.3)

## 2012-01-15 LAB — CBC
HCT: 28.1 % — ABNORMAL LOW (ref 39.0–52.0)
Hemoglobin: 9.8 g/dL — ABNORMAL LOW (ref 13.0–17.0)
MCH: 35.6 pg — ABNORMAL HIGH (ref 26.0–34.0)
MCHC: 34.9 g/dL (ref 30.0–36.0)
RDW: 18 % — ABNORMAL HIGH (ref 11.5–15.5)

## 2012-01-15 MED ORDER — DIPHENHYDRAMINE HCL 50 MG/ML IJ SOLN
12.5000 mg | INTRAMUSCULAR | Status: DC | PRN
  Administered 2012-01-15: 12.5 mg via INTRAVENOUS
  Filled 2012-01-15: qty 1

## 2012-01-15 MED ORDER — KETOROLAC TROMETHAMINE 30 MG/ML IJ SOLN
30.0000 mg | Freq: Once | INTRAMUSCULAR | Status: AC
  Administered 2012-01-15: 30 mg via INTRAVENOUS
  Filled 2012-01-15: qty 1

## 2012-01-15 MED ORDER — DEXTROSE-NACL 5-0.45 % IV SOLN
INTRAVENOUS | Status: DC
  Administered 2012-01-15: 13:00:00 via INTRAVENOUS
  Filled 2012-01-15: qty 1000

## 2012-01-15 MED ORDER — HYDROMORPHONE HCL PF 2 MG/ML IJ SOLN
4.0000 mg | INTRAMUSCULAR | Status: DC
  Administered 2012-01-15 (×2): 4 mg via INTRAVENOUS
  Filled 2012-01-15 (×2): qty 2

## 2012-01-15 MED ORDER — ONDANSETRON HCL 4 MG/2ML IJ SOLN
4.0000 mg | INTRAMUSCULAR | Status: DC
  Administered 2012-01-15: 4 mg via INTRAVENOUS
  Filled 2012-01-15: qty 2

## 2012-01-15 MED ORDER — ACETAMINOPHEN 500 MG PO TABS
1000.0000 mg | ORAL_TABLET | Freq: Once | ORAL | Status: AC
  Administered 2012-01-15: 1000 mg via ORAL
  Filled 2012-01-15: qty 2

## 2012-01-15 NOTE — Discharge Instructions (Signed)
Sickle Cell Pain Crisis Sickle cell anemia requires regular medical attention by your healthcare provider and awareness about when to seek medical care. Pain is a common problem in children with sickle cell disease. This usually starts at less than 26 year of age. Pain can occur nearly anywhere in the body but most commonly occurs in the extremities, back, chest, or belly (abdomen). Pain episodes can start suddenly or may follow an illness. These attacks can appear as decreased activity, loss of appetite, change in behavior, or simply complaints of pain. DIAGNOSIS   Specialized blood and gene testing can help make this diagnosis early in the disease. Blood tests may then be done to watch blood levels.   Specialized brain scans are done when there are problems in the brain during a crisis.   Lung testing may be done later in the disease.  HOME CARE INSTRUCTIONS   Maintain good hydration. Increase you or your child's fluid intake in hot weather and during exercise.   Avoid smoking. Smoking lowers the oxygen in the blood and can cause the production of sickle-shaped cells (sickling).   Control pain. Only take over-the-counter or prescription medicines for pain, discomfort, or fever as directed by your caregiver. Do not give aspirin to children because of the association with Reye's syndrome.   Keep regular health care checks to keep a proper red blood cell (hemoglobin) level. A moderate anemia level protects against sickling crises.   You and your child should receive all the same immunizations and care as the people around you.   Mothers should breastfeed their babies if possible. Use formulas with iron added if breastfeeding is not possible. Additional iron should not be given unless there is a lack of it. People with sickle cell disease (SCD) build up iron faster than normal. Give folic acid and additional vitamins as directed.   If you or your child has been prescribed antibiotics or other  medications to prevent problems, take them as directed.   Summer camps are available for children with SCD. They may help young people deal with their disease. The camps introduce them to other children with the same problem.   Young people with SCD may become frustrated or angry at their disease. This can cause rebellion and refusal to follow medical care. Help groups or counseling may help with these problems.   Wear a medical alert bracelet. When traveling, keep medical information, caregiver's names, and the medications you or your child takes with you at all times.  SEEK IMMEDIATE MEDICAL CARE IF:   You or your child develops dizziness or fainting, numbness in or difficulty with movement of arms and legs, difficulty with speech, or is acting abnormally. This could be early signs of a stroke. Immediate treatment is necessary.   You or your child has an oral temperature above 102 F (38.9 C), not controlled by medicine.   You or your child has other signs of infection (chills, lethargy, irritability, poor eating, vomiting). The younger the child, the more you should be concerned.   With fevers, do not give medicine to lower the fever right away. This could cover up a problem that is developing. Notify your caregiver.   You or your child develops pain that is not helped with medicine.   You or your child develops shortness of breath or is coughing up pus-like or bloody sputum.   You or your child develops any problems that are new and are causing you to worry.   You or   your child develops a persistent, often uncomfortable and painful penile erection. This is called priapism. Always check young boys for this. It is often embarrassing for them and they may not bring it to your attention. This is a medical emergency and needs immediate treatment. If this is not treated it will lead to impotence.   You or your child develops a new onset of abdominal pain, especially on the left side near the  stomach area.   You or your child has any questions or has problems that are not getting better. Return immediately if you feel your child is getting worse, even if your child was seen only a short while ago.  Document Released: 07/16/2005 Document Revised: 09/25/2011 Document Reviewed: 12/05/2009 [employer] Patient Information 2012 [employer]. 

## 2012-01-15 NOTE — H&P (Signed)
[facility] History and Physical   Date: 01/15/2012  Patient name: Justin Sharp Medical record number: [id number] Date of birth: 04-11-1986 Age: 26 y.o. Gender: male PCP: HALPERN,DAVID, MD, MD  Attending physician: Gwenyth Bender, MD  Chief Complaint: Pain in back and legs  History of Present Illness: Justin Sharp is a 26 year-old African-American male, that was seen in the [facility] yesterday with similar complaints and returns for additional pain management today.  The patient states that the recent cold weather and school-related stress is what has precipitated his crisis.  His pain is rated as an 8/10 in his bilateral LEs and his back.   The patient denies any other symptomatology.   Meds: Prescriptions prior to admission  Medication Sig Dispense Refill  . folic acid (FOLVITE) 1 MG tablet Take 1 mg by mouth daily.       Marland Kitchen HYDROmorphone (DILAUDID) 8 MG tablet Take 8 mg by mouth every 4 (four) hours as needed.      . hydroxyurea (HYDREA) 500 MG capsule Take 1,000 mg by mouth daily. May take with food to minimize GI side effects.       . Multiple Vitamin (MULITIVITAMIN WITH MINERALS) TABS Take 1 tablet by mouth daily.      Marland Kitchen oxycodone (OXY-IR) 5 MG capsule Take 10 mg by mouth every 4 (four) hours as needed. For pain      . potassium chloride (K-DUR) 10 MEQ tablet Take 2 tablets (20 mEq total) by mouth daily.  30 tablet  0    Allergies: Sulfa antibiotics and Sulfa drugs cross reactors Past Medical History  Diagnosis Date  . Sickle cell anemia    Past Surgical History  Procedure Date  . Appendectomy    No family history on file. History   Social History  . Marital Status: Single    Spouse Name: N/A    Number of Children: N/A  . Years of Education: N/A   Occupational History  . Not on file.   Social History Main Topics  . Smoking status: Never Smoker   . Smokeless tobacco: Not on file  . Alcohol Use: No  . Drug Use: No  . Sexually Active:    Other Topics  Concern  . Not on file   Social History Narrative  . No narrative on file    Review of Systems: Pertinent items are noted in HPI.  Physical Exam: Blood pressure 122/72, pulse 82, temperature 99.7 F (37.6 C), temperature source Oral, resp. rate 20, SpO2 99.00%. BP 122/72  Pulse 82  Temp(Src) 99.7 F (37.6 C) (Oral)  Resp 20  SpO2 99%  General Appearance:    Alert, cooperative, mild distress, appears stated age  Head:    Normocephalic, without obvious abnormality, atraumatic  Eyes:    PERRLA, conjunctiva/corneas clear, EOM's intact, fundi    benign, both eyes      Nose:   Nares normal, septum midline, mucosa normal, no drainage    or sinus tenderness  Throat:   Lips, mucosa, and tongue normal; teeth and gums normal  Neck:   Supple, symmetrical, trachea midline, no adenopathy;       thyroid:  No enlargement/tenderness/nodules; no carotid   bruit or JVD  Back:     Symmetric, no curvature, ROM normal, mild CVA tenderness  Lungs:     Clear to auscultation bilaterally, respirations unlabored  Chest wall:    No tenderness or deformity  Heart:    Regular rate and rhythm, S1 and  S2 normal, no murmur, rub   or gallop  Abdomen:     Soft, non-tender, bowel sounds active all four quadrants,    no masses, no organomegaly  Extremities:   Extremities normal, atraumatic, no cyanosis or edema  Pulses:   2+ and symmetric all extremities  Skin:   Skin color, texture, turgor normal, no rashes or lesions  Neurologic:   CNII-XII intact. Normal strength, sensation and reflexes      throughout    Lab results: Results for orders placed during the hospital encounter of 01/14/12 (from the past 24 hour(s))  CBC     Status: Abnormal   Collection Time   01/14/12 12:10 PM      Component Value Range   WBC 7.3  4.0 - 10.5 (K/uL)   RBC 2.53 (*) 4.22 - 5.81 (MIL/uL)   Hemoglobin 8.9 (*) 13.0 - 17.0 (g/dL)   HCT 78.2 (*) 95.6 - 52.0 (%)   MCV 102.8 (*) 78.0 - 100.0 (fL)   MCH 35.2 (*) 26.0 - 34.0 (pg)    MCHC 34.2  30.0 - 36.0 (g/dL)   RDW 21.3 (*) 08.6 - 15.5 (%)   Platelets 645 (*) 150 - 400 (K/uL)  DIFFERENTIAL     Status: Abnormal   Collection Time   01/14/12 12:10 PM      Component Value Range   Neutrophils Relative 84 (*) 43 - 77 (%)   Neutro Abs 6.1  1.7 - 7.7 (K/uL)   Lymphocytes Relative 11 (*) 12 - 46 (%)   Lymphs Abs 0.8  0.7 - 4.0 (K/uL)   Monocytes Relative 5  3 - 12 (%)   Monocytes Absolute 0.4  0.1 - 1.0 (K/uL)   Eosinophils Relative 0  0 - 5 (%)   Eosinophils Absolute 0.0  0.0 - 0.7 (K/uL)   Basophils Relative 0  0 - 1 (%)   Basophils Absolute 0.0  0.0 - 0.1 (K/uL)  COMPREHENSIVE METABOLIC PANEL     Status: Abnormal   Collection Time   01/14/12 12:10 PM      Component Value Range   Sodium 141  135 - 145 (mEq/L)   Potassium 3.1 (*) 3.5 - 5.1 (mEq/L)   Chloride 105  96 - 112 (mEq/L)   CO2 28  19 - 32 (mEq/L)   Glucose, Bld 126 (*) 70 - 99 (mg/dL)   BUN 6  6 - 23 (mg/dL)   Creatinine, Ser 5.78  0.50 - 1.35 (mg/dL)   Calcium 9.3  8.4 - 46.9 (mg/dL)   Total Protein 7.8  6.0 - 8.3 (g/dL)   Albumin 4.2  3.5 - 5.2 (g/dL)   AST 25  0 - 37 (U/L)   ALT 16  0 - 53 (U/L)   Alkaline Phosphatase 57  39 - 117 (U/L)   Total Bilirubin 0.9  0.3 - 1.2 (mg/dL)   GFR calc non Af Amer >90  >90 (mL/min)   GFR calc Af Amer >90  >90 (mL/min)    Imaging results:  No results found.   Assessment & Plan: Sickle cell anemia with pain Situational stress   Plan:  The patient will receive aggressive IV hydration, pain/nausea management and antipyretic for low-grade fever.  Will reassess and the end of the day.    Larina Bras 01/15/2012, 11:35 AM

## 2012-01-20 ENCOUNTER — Other Ambulatory Visit: Payer: Self-pay

## 2012-01-20 ENCOUNTER — Non-Acute Institutional Stay (HOSPITAL_COMMUNITY)
Admission: AD | Admit: 2012-01-20 | Discharge: 2012-01-20 | Disposition: A | Discharge status: Home / Self Care | Payer: PRIVATE HEALTH INSURANCE | Attending: Internal Medicine | Admitting: Internal Medicine

## 2012-01-20 ENCOUNTER — Encounter (HOSPITAL_COMMUNITY): Payer: Self-pay | Admitting: Emergency Medicine

## 2012-01-20 ENCOUNTER — Emergency Department (HOSPITAL_COMMUNITY)
Admission: EM | Admit: 2012-01-20 | Discharge: 2012-01-20 | Payer: PRIVATE HEALTH INSURANCE | Attending: Emergency Medicine | Admitting: Emergency Medicine

## 2012-01-20 DIAGNOSIS — E876 Hypokalemia: Secondary | ICD-10-CM | POA: Insufficient documentation

## 2012-01-20 DIAGNOSIS — D571 Sickle-cell disease without crisis: Secondary | ICD-10-CM | POA: Insufficient documentation

## 2012-01-20 DIAGNOSIS — F430 Acute stress reaction: Secondary | ICD-10-CM | POA: Insufficient documentation

## 2012-01-20 DIAGNOSIS — R0789 Other chest pain: Secondary | ICD-10-CM | POA: Insufficient documentation

## 2012-01-20 DIAGNOSIS — D5700 Hb-SS disease with crisis, unspecified: Secondary | ICD-10-CM | POA: Insufficient documentation

## 2012-01-20 DIAGNOSIS — Z79899 Other long term (current) drug therapy: Secondary | ICD-10-CM | POA: Insufficient documentation

## 2012-01-20 DIAGNOSIS — Z86718 Personal history of other venous thrombosis and embolism: Secondary | ICD-10-CM | POA: Insufficient documentation

## 2012-01-20 DIAGNOSIS — F439 Reaction to severe stress, unspecified: Secondary | ICD-10-CM

## 2012-01-20 DIAGNOSIS — R079 Chest pain, unspecified: Secondary | ICD-10-CM | POA: Insufficient documentation

## 2012-01-20 LAB — DIFFERENTIAL
Eosinophils Absolute: 0.1 10*3/uL (ref 0.0–0.7)
Eosinophils Relative: 1 % (ref 0–5)
Lymphs Abs: 2.9 10*3/uL (ref 0.7–4.0)
Monocytes Relative: 9 % (ref 3–12)

## 2012-01-20 LAB — CBC
MCH: 34.5 pg — ABNORMAL HIGH (ref 26.0–34.0)
MCHC: 34.6 g/dL (ref 30.0–36.0)
MCV: 99.6 fL (ref 78.0–100.0)
Platelets: 602 10*3/uL — ABNORMAL HIGH (ref 150–400)
RBC: 2.29 MIL/uL — ABNORMAL LOW (ref 4.22–5.81)

## 2012-01-20 LAB — COMPREHENSIVE METABOLIC PANEL
ALT: 11 U/L (ref 0–53)
AST: 17 U/L (ref 0–37)
Albumin: 3.6 g/dL (ref 3.5–5.2)
Chloride: 106 mEq/L (ref 96–112)
Creatinine, Ser: 0.49 mg/dL — ABNORMAL LOW (ref 0.50–1.35)
Potassium: 3.3 mEq/L — ABNORMAL LOW (ref 3.5–5.1)
Sodium: 138 mEq/L (ref 135–145)
Total Bilirubin: 0.8 mg/dL (ref 0.3–1.2)

## 2012-01-20 MED ORDER — HYDROMORPHONE HCL PF 2 MG/ML IJ SOLN
2.0000 mg | Freq: Once | INTRAMUSCULAR | Status: AC
  Administered 2012-01-20: 2 mg via INTRAVENOUS
  Filled 2012-01-20: qty 1

## 2012-01-20 MED ORDER — OXYCODONE HCL 5 MG PO CAPS: 10.0000 mg | ORAL_CAPSULE | ORAL | Status: DC | PRN

## 2012-01-20 MED ORDER — ONDANSETRON HCL 4 MG PO TABS: 4.0000 mg | ORAL_TABLET | ORAL | Status: DC | PRN

## 2012-01-20 MED ORDER — ONDANSETRON HCL 4 MG/2ML IJ SOLN
4.0000 mg | Freq: Once | INTRAMUSCULAR | Status: AC
  Administered 2012-01-20: 4 mg via INTRAVENOUS
  Filled 2012-01-20: qty 2

## 2012-01-20 MED ORDER — DIPHENHYDRAMINE HCL 50 MG/ML IJ SOLN
25.0000 mg | INTRAMUSCULAR | Status: DC | PRN
Start: 2012-01-20 — End: 2012-01-20
  Administered 2012-01-20: 14:00:00 via INTRAVENOUS
  Filled 2012-01-20: qty 1

## 2012-01-20 MED ORDER — POTASSIUM CHLORIDE CRYS ER 20 MEQ PO TBCR: 40.0000 meq | EXTENDED_RELEASE_TABLET | Freq: Once | ORAL | Status: DC

## 2012-01-20 MED ORDER — DEXTROSE-NACL 5-0.45 % IV SOLN
INTRAVENOUS | Status: DC
  Administered 2012-01-20 (×2): via INTRAVENOUS

## 2012-01-20 MED ORDER — FOLIC ACID 1 MG PO TABS
1.0000 mg | ORAL_TABLET | Freq: Every day | ORAL | Status: DC
  Administered 2012-01-20: 1 mg via ORAL
  Filled 2012-01-20: qty 1

## 2012-01-20 MED ORDER — SODIUM CHLORIDE 0.9 % IV BOLUS (SEPSIS)
1000.0000 mL | Freq: Once | INTRAVENOUS | Status: AC
  Administered 2012-01-20: 1000 mL via INTRAVENOUS

## 2012-01-20 MED ORDER — HYDROMORPHONE HCL PF 1 MG/ML IJ SOLN
2.0000 mg | INTRAMUSCULAR | Status: DC | PRN
  Administered 2012-01-20 (×3): 2 mg via INTRAVENOUS
  Filled 2012-01-20 (×3): qty 2

## 2012-01-20 MED ORDER — ONDANSETRON HCL 4 MG/2ML IJ SOLN: 4.0000 mg | INTRAMUSCULAR | Status: DC | PRN

## 2012-01-20 MED ORDER — DIPHENHYDRAMINE HCL 50 MG/ML IJ SOLN
25.0000 mg | Freq: Once | INTRAMUSCULAR | Status: AC
  Administered 2012-01-20: 25 mg via INTRAVENOUS
  Filled 2012-01-20: qty 1

## 2012-01-20 MED ORDER — DIPHENHYDRAMINE HCL 25 MG PO CAPS: 25.0000 mg | ORAL_CAPSULE | ORAL | Status: DC | PRN

## 2012-01-20 NOTE — Discharge Summary (Signed)
[facility] Discharge Summary   Patient ID: Justin Sharp MRN: [id number] DOB/AGE: Jul 27, 1986 25 y.o.  Admit date: 01/20/2012 Discharge date: 01/20/2012  Primary Care Physician:  Fonnie Mu, MD, MD  Admission Diagnoses:  Active Problems:  Sickle cell anemia with pain  Situational stress  Hypokalemia   Discharge Diagnoses:   Sickle cell anemia with pain Hypokalemia  Discharge Medications:  Medication List  As of 01/20/2012  3:08 PM   TAKE these medications         folic acid 1 MG tablet   Commonly known as: FOLVITE   Take 1 mg by mouth daily.      HYDROmorphone 8 MG tablet   Commonly known as: DILAUDID   Take 8 mg by mouth every 4 (four) hours as needed.      hydroxyurea 500 MG capsule   Commonly known as: HYDREA   Take 1,000 mg by mouth daily. May take with food to minimize GI side effects.      mulitivitamin with minerals Tabs   Take 1 tablet by mouth daily.      oxycodone 5 MG capsule   Commonly known as: OXY-IR   Take 2 capsules (10 mg total) by mouth every 4 (four) hours as needed. For pain      potassium chloride 10 MEQ tablet   Commonly known as: K-DUR   Take 2 tablets (20 mEq total) by mouth daily.            Consults:  None  Significant Diagnostic Studies:    [facility] Course:  For complete details please refer to admission H and P, but in brief, Justin Sharp is a 26 year-old African-American male, who presented to [facility] complaining of having an acute sickle cell crisis with pain primarily in his chest area (non-cardiac).  While in the day hospital the patient received aggressive pain/nausea/pruritis management in addition to IV hydration.  The patient is now stating that he is able to manage his pain at home and he will be discharged to home at this time in stable condition.    Physical Exam at Discharge: BP 113/71  Pulse 81  Temp(Src) 98.3 F (36.8 C) (Oral)  Resp 18  SpO2 97%  Gen: Alert, oriented, well  nourished, well developed, no apparent distress Cardiovascular:  S1, S2 regular, no murmur/click/rub/gallop Respiratory: CTA bilaterally, no wheezes/rales/rhonchi Gastrointestinal: Soft, non-tender, non-distended, hypoactive bowel sounds, no organomegaly Extremities: No deformities, no focal deficits, atraumatic, no cyanosis, no edema   Disposition at Discharge: 01- Home or Self Care   Discharge Orders: Discharge Orders    Future Orders Please Complete By Expires   Increase activity slowly      Discharge instructions      Comments:   Take all medications as prescribed Keep warm Drink plenty of fluids Get plenty of rest Use incentive spirometer daily Keep all follow up appointments   Call MD for:  severe uncontrolled pain         Condition at Discharge:   Stable  Time spent on Discharge:  Greater than 30 minutes.  SignedLarina Bras 01/20/2012, 3:08 PM

## 2012-01-20 NOTE — ED Notes (Signed)
Pt has a history of sickle cell since birth and is in crisis at this time. States he has been having chest pain since this past Saturday. States he gets chest pain when he is in crisis. Pt alert and oriented x 4, neuro intact. Pt placed on 2L of O2 via nasal cannula.

## 2012-01-20 NOTE — H&P (Signed)
Patient seen and examined .  Agree with assessment and plan as outlined.  

## 2012-01-20 NOTE — ED Notes (Signed)
Pt states he is having chest pain that started about 2 days ago   Pt states he has sickle cell disease

## 2012-01-20 NOTE — ED Provider Notes (Signed)
History    25yM with chest pain. History of sickle cell anemia. Says current symptoms are consistent with prior crises. No shortness of breath. Denies significant pain anywhere else. No fevers or chills. No abdominal pain. No joint swelling. Gradual onset of pain earlier today. Has been taking Dilaudid at home with minimal relief. Reports compliance with folic acid and hydroxyurea. Past history of pulmonary embolism and acute chest syndrome.   CSN: [id number]  Arrival date & time 01/20/12  0446   First MD Initiated Contact with Patient 01/20/12 [phone]      Chief Complaint  Patient presents with  . Sickle Cell Pain Crisis    (Consider location/radiation/quality/duration/timing/severity/associated sxs/prior treatment) HPI  Past Medical History  Diagnosis Date  . Sickle cell anemia     Past Surgical History  Procedure Date  . Appendectomy     Family History  Problem Relation Age of Onset  . Diabetes Other   . Coronary artery disease Other     History  Substance Use Topics  . Smoking status: Never Smoker   . Smokeless tobacco: Not on file  . Alcohol Use: No      Review of Systems   Review of symptoms negative unless otherwise noted in HPI.   Allergies  Sulfa antibiotics and Sulfa drugs cross reactors  Home Medications   Current Outpatient Rx  Name Route Sig Dispense Refill  . FOLIC ACID 1 MG PO TABS Oral Take 1 mg by mouth daily.     Marland Kitchen HYDROMORPHONE HCL 8 MG PO TABS Oral Take 8 mg by mouth every 4 (four) hours as needed.    Marland Kitchen HYDROXYUREA 500 MG PO CAPS Oral Take 1,000 mg by mouth daily. May take with food to minimize GI side effects.     . ADULT MULTIVITAMIN W/MINERALS CH Oral Take 1 tablet by mouth daily.    . OXYCODONE HCL 5 MG PO CAPS Oral Take 10 mg by mouth every 4 (four) hours as needed. For pain    . POTASSIUM CHLORIDE ER 10 MEQ PO TBCR Oral Take 2 tablets (20 mEq total) by mouth daily. 30 tablet 0    BP 118/73  Pulse 90  Temp(Src) 99.4 F (37.4 C)  (Oral)  Resp 16  SpO2 100%  Physical Exam  Nursing note and vitals reviewed. Constitutional: He appears well-developed and well-nourished. No distress.       Sitting up in bed. No acute distress.  HENT:  Head: Normocephalic and atraumatic.  Eyes: Conjunctivae are normal. Pupils are equal, round, and reactive to light. Right eye exhibits no discharge. Left eye exhibits no discharge.  Neck: Neck supple.  Cardiovascular: Normal rate, regular rhythm and normal heart sounds.  Exam reveals no gallop and no friction rub.   No murmur heard. Pulmonary/Chest: Effort normal and breath sounds normal. No respiratory distress. He exhibits no tenderness.  Abdominal: Soft. He exhibits no distension. There is no tenderness.  Musculoskeletal: He exhibits no edema and no tenderness.       Lower extremities are symmetric as compared to each other. There is no calf tenderness. No edema.  Neurological: He is alert.  Skin: Skin is warm and dry. He is not diaphoretic.  Psychiatric: He has a normal mood and affect. His behavior is normal. Thought content normal.    ED Course  Procedures (including critical care time)  Labs Reviewed - No data to display No results found.  EKG:  Rhythm: Normal sinus rhythm Rate: 85 Axis: normals Intervals: normal ST segments:  normal   1. Chest pain   2. Sickle cell anemia with crisis       MDM  26 year old male with chest pain. Patient has a history of sickle cell onset of his current symptoms are consistent with prior sickle cell crises. Is not complaining shortness of breath. He is not hypoxic. No respiratory distress. Afebrile. Multiple doses of IV pain medication were given in the emergency room with only mild improvement of symptoms. Will discuss for transfer to sickle cell center.        Raeford Razor, MD 01/20/12 0830

## 2012-03-03 ENCOUNTER — Inpatient Hospital Stay (HOSPITAL_COMMUNITY)
Admission: AD | Admit: 2012-03-03 | Discharge: 2012-03-04 | DRG: 812 | Disposition: A | Discharge status: Other Acute Inpatient Hospital | Payer: PRIVATE HEALTH INSURANCE | Attending: Internal Medicine | Admitting: Internal Medicine

## 2012-03-03 ENCOUNTER — Non-Acute Institutional Stay (HOSPITAL_COMMUNITY): Payer: PRIVATE HEALTH INSURANCE

## 2012-03-03 ENCOUNTER — Encounter (HOSPITAL_COMMUNITY): Payer: Self-pay | Admitting: *Deleted

## 2012-03-03 DIAGNOSIS — F430 Acute stress reaction: Secondary | ICD-10-CM | POA: Diagnosis present

## 2012-03-03 DIAGNOSIS — E876 Hypokalemia: Secondary | ICD-10-CM | POA: Diagnosis present

## 2012-03-03 DIAGNOSIS — D5700 Hb-SS disease with crisis, unspecified: Principal | ICD-10-CM | POA: Diagnosis present

## 2012-03-03 DIAGNOSIS — M545 Low back pain, unspecified: Secondary | ICD-10-CM | POA: Diagnosis present

## 2012-03-03 DIAGNOSIS — N4830 Priapism, unspecified: Secondary | ICD-10-CM | POA: Diagnosis present

## 2012-03-03 DIAGNOSIS — M79609 Pain in unspecified limb: Secondary | ICD-10-CM | POA: Diagnosis present

## 2012-03-03 LAB — COMPREHENSIVE METABOLIC PANEL
BUN: 12 mg/dL (ref 6–23)
Calcium: 9 mg/dL (ref 8.4–10.5)
GFR calc Af Amer: >90 mL/min (ref >90)
Glucose, Bld: 123 mg/dL — ABNORMAL HIGH (ref 70–99)
Total Protein: 7.9 g/dL (ref 6.0–8.3)

## 2012-03-03 LAB — DIFFERENTIAL
Basophils Absolute: 0 10*3/uL (ref 0.0–0.1)
Lymphocytes Relative: 28 % (ref 12–46)
Monocytes Absolute: 1 10*3/uL (ref 0.1–1.0)
Monocytes Relative: 13 % — ABNORMAL HIGH (ref 3–12)
Neutro Abs: 4.5 10*3/uL (ref 1.7–7.7)

## 2012-03-03 LAB — CBC
HCT: 23.4 % — ABNORMAL LOW (ref 39.0–52.0)
Hemoglobin: 8.3 g/dL — ABNORMAL LOW (ref 13.0–17.0)
RBC: 2.33 MIL/uL — ABNORMAL LOW (ref 4.22–5.81)
WBC: 7.8 10*3/uL (ref 4.0–10.5)

## 2012-03-03 MED ORDER — KCL IN DEXTROSE-NACL 20-5-0.45 MEQ/L-%-% IV SOLN
INTRAVENOUS | Status: DC
  Administered 2012-03-03: 23:00:00 via INTRAVENOUS
  Filled 2012-03-03 (×2): qty 1000

## 2012-03-03 MED ORDER — SODIUM CHLORIDE 0.9 % IJ SOLN: 9.0000 mL | INTRAMUSCULAR | Status: DC | PRN

## 2012-03-03 MED ORDER — DIPHENHYDRAMINE HCL 25 MG PO CAPS: 25.0000 mg | ORAL_CAPSULE | ORAL | Status: DC | PRN

## 2012-03-03 MED ORDER — FOLIC ACID 1 MG PO TABS
1.0000 mg | ORAL_TABLET | Freq: Every day | ORAL | Status: DC
  Administered 2012-03-04: 1 mg via ORAL
  Filled 2012-03-03: qty 1

## 2012-03-03 MED ORDER — ENOXAPARIN SODIUM 40 MG/0.4ML ~~LOC~~ SOLN
40.0000 mg | SUBCUTANEOUS | Status: DC
  Filled 2012-03-03: qty 0.4

## 2012-03-03 MED ORDER — HYDROMORPHONE 0.3 MG/ML IV SOLN
INTRAVENOUS | Status: DC
  Administered 2012-03-03 (×2): via INTRAVENOUS
  Administered 2012-03-03: 3.5 mg via INTRAVENOUS
  Administered 2012-03-04: 05:00:00 via INTRAVENOUS
  Administered 2012-03-04: 7.33 mg via INTRAVENOUS
  Administered 2012-03-04: 4.49 mg via INTRAVENOUS
  Administered 2012-03-04: 4 mg via INTRAVENOUS
  Filled 2012-03-03 (×3): qty 25

## 2012-03-03 MED ORDER — DIPHENHYDRAMINE HCL 50 MG/ML IJ SOLN
12.5000 mg | INTRAMUSCULAR | Status: DC | PRN
  Administered 2012-03-03 (×2): 25 mg via INTRAVENOUS
  Administered 2012-03-04: 12.5 mg via INTRAVENOUS
  Administered 2012-03-04: 25 mg via INTRAVENOUS
  Filled 2012-03-03 (×4): qty 1

## 2012-03-03 MED ORDER — POTASSIUM CHLORIDE CRYS ER 20 MEQ PO TBCR
40.0000 meq | EXTENDED_RELEASE_TABLET | Freq: Two times a day (BID) | ORAL | Status: DC
  Administered 2012-03-03 – 2012-03-04 (×2): 40 meq via ORAL
  Filled 2012-03-03 (×3): qty 2

## 2012-03-03 MED ORDER — ONDANSETRON HCL 4 MG/2ML IJ SOLN
4.0000 mg | INTRAMUSCULAR | Status: DC | PRN
  Administered 2012-03-03 (×2): 4 mg via INTRAVENOUS
  Filled 2012-03-03 (×2): qty 2

## 2012-03-03 MED ORDER — HYDROMORPHONE HCL PF 4 MG/ML IJ SOLN
INTRAMUSCULAR | Status: AC
  Administered 2012-03-03: 4 mg
  Filled 2012-03-03: qty 1

## 2012-03-03 MED ORDER — NALOXONE HCL 0.4 MG/ML IJ SOLN: 0.4000 mg | INTRAMUSCULAR | Status: DC | PRN

## 2012-03-03 MED ORDER — DEXTROSE-NACL 5-0.45 % IV SOLN
INTRAVENOUS | Status: DC
  Administered 2012-03-03: 14:00:00 via INTRAVENOUS

## 2012-03-03 MED ORDER — ONDANSETRON HCL 4 MG PO TABS: 4.0000 mg | ORAL_TABLET | ORAL | Status: DC | PRN

## 2012-03-03 MED ORDER — HYDROMORPHONE HCL PF 1 MG/ML IJ SOLN
1.0000 mg | INTRAMUSCULAR | Status: DC | PRN
  Administered 2012-03-03: 1 mg via INTRAVENOUS
  Filled 2012-03-03: qty 1

## 2012-03-03 MED ORDER — HYDROMORPHONE HCL PF 2 MG/ML IJ SOLN: 2.0000 mg | INTRAMUSCULAR | Status: DC | PRN

## 2012-03-03 NOTE — Progress Notes (Signed)
Patient to be admitted to [facility] for pain management/ sickle cell crisis. Report called to RN. Patient on PCA, no complaints at this time. VSS, pt in no apparent distress. Nursing Staff Transferred patient via wheelchair to Room 1336. Patient resting safely in bed.

## 2012-03-03 NOTE — H&P (Signed)
Patient ID: Justin Sharp, male   DOB: 1986-05-05, 26 y.o.   MRN: [id number]  Chief Complaint  Patient presents with  . Sickle Cell Pain Crisis    HPI Justin Sharp is a 26 y.o. male. Patient has hemoglobin SS disease who presented to the medical day unit complaining of ongoing chest limb pain. This is been present for approximately 5 days. Patient has been febrile in between [place] and are most recently. 5 days ago he went to the ER in [place]. He was treated the air briefly and sent home. He notes he continued to take his medication but did not get significant relief. He did fly a plane back to [place] within the past 24 hours. He presented here for further treatment. Patient most recently has not been consistent with regular medical followup with his physician in [place]. He states he's been taking his medications on regular basis. He denies significant leg pains. He has had increased stressors over the past several months. Denies significant cough or cold symptoms. There has been no associated fever or night sweats. Denies headaches. No localized weakness.  Past Medical History  Diagnosis Date  . Sickle cell anemia     Past Surgical History  Procedure Date  . Appendectomy     Family History  Problem Relation Age of Onset  . Diabetes Other   . Coronary artery disease Other     Social History History  Substance Use Topics  . Smoking status: Never Smoker   . Smokeless tobacco: Not on file  . Alcohol Use: No    Allergies  Allergen Reactions  . Sulfa Antibiotics Hives  . Sulfa Drugs Cross Reactors Hives    Current Facility-Administered Medications  Medication Dose Route Frequency Provider Last Rate Last Dose  . dextrose 5 %-0.45 % sodium chloride infusion   Intravenous Continuous Gwenyth Bender, MD 125 mL/hr at 03/03/12 1347    . diphenhydrAMINE (BENADRYL) capsule 25-50 mg  25-50 mg Oral Q4H PRN Gwenyth Bender, MD       Or  . diphenhydrAMINE (BENADRYL) injection  12.5-25 mg  12.5-25 mg Intravenous Q4H PRN Gwenyth Bender, MD   25 mg at 03/03/12 1353  . HYDROmorphone (DILAUDID) 4 MG/ML injection        4 mg at 03/03/12 1348  . HYDROmorphone (DILAUDID) PCA injection 0.3 mg/mL   Intravenous Q4H Gwenyth Bender, MD      . naloxone [facility]) injection 0.4 mg  0.4 mg Intravenous PRN Gwenyth Bender, MD       And  . sodium chloride 0.9 % injection 9 mL  9 mL Intravenous PRN Gwenyth Bender, MD      . ondansetron [facility]) tablet 4 mg  4 mg Oral Q4H PRN Gwenyth Bender, MD       Or  . ondansetron [facility]) injection 4 mg  4 mg Intravenous Q4H PRN Gwenyth Bender, MD   4 mg at 03/03/12 1356  . DISCONTD: HYDROmorphone (DILAUDID) injection 2-4 mg  2-4 mg Intravenous Q2H PRN Gwenyth Bender, MD        Review of Systems As noted above.  Blood pressure 106/64, pulse 89, temperature 97.9 F (36.6 C), temperature source Oral, resp. rate 14, SpO2 98.00%.  Physical Exam Well-developed well-nourished black male presently resting after pain medication. Rates his pain as a 8/10. HEENT: No sinus tenderness. Minimal sclera icterus. Posterior pharynx clear. NECK: No enlarged thyroid. No posterior cervical nodes. LUNGS: Clear to auscultation.  No wheezes. No vocal fremitus. CV: Normal S1, S2 without S3. No murmurs or rubs. ABDOMEN: No masses or tenderness. MSK: Minimal chest wall tenderness to palpation. Minimal right a.c. joint tenderness. No tenderness in the lower lumbar sacral spine. Negative Homans. No edema. NEURO: Mildly sedated from pain medication. Answers questions appropriately. Moves all extremities. Nonfocal. SKIN: No exercises skin infections. 2 left shoulder. Data Reviewed  Results for orders placed during the hospital encounter of 03/03/12 (from the past 48 hour(s))  COMPREHENSIVE METABOLIC PANEL     Status: Abnormal   Collection Time   03/03/12  2:34 PM      Component Value Range Comment   Sodium 140  135 - 145 (mEq/L)    Potassium 3.0 (*) 3.5 - 5.1 (mEq/L)    Chloride 101  96 -  112 (mEq/L)    CO2 30  19 - 32 (mEq/L)    Glucose, Bld 123 (*) 70 - 99 (mg/dL)    BUN 12  6 - 23 (mg/dL)    Creatinine, Ser 4.09  0.50 - 1.35 (mg/dL)    Calcium 9.0  8.4 - 10.5 (mg/dL)    Total Protein 7.9  6.0 - 8.3 (g/dL)    Albumin 4.1  3.5 - 5.2 (g/dL)    AST 41 (*) 0 - 37 (U/L)    ALT 40  0 - 53 (U/L)    Alkaline Phosphatase 69  39 - 117 (U/L)    Total Bilirubin 1.7 (*) 0.3 - 1.2 (mg/dL)    GFR calc non Af Amer >90  >90 (mL/min)    GFR calc Af Amer >90  >90 (mL/min)   CBC     Status: Abnormal   Collection Time   03/03/12  2:34 PM      Component Value Range Comment   WBC 7.8  4.0 - 10.5 (K/uL)    RBC 2.33 (*) 4.22 - 5.81 (MIL/uL)    Hemoglobin 8.3 (*) 13.0 - 17.0 (g/dL)    HCT 81.1 (*) 91.4 - 52.0 (%)    MCV 100.4 (*) 78.0 - 100.0 (fL)    MCH 35.6 (*) 26.0 - 34.0 (pg)    MCHC 35.5  30.0 - 36.0 (g/dL)    RDW 78.2 (*) 95.6 - 15.5 (%)    Platelets 500 (*) 150 - 400 (K/uL)   DIFFERENTIAL     Status: Abnormal   Collection Time   03/03/12  2:34 PM      Component Value Range Comment   Neutrophils Relative 57  43 - 77 (%)    Neutro Abs 4.5  1.7 - 7.7 (K/uL)    Lymphocytes Relative 28  12 - 46 (%)    Lymphs Abs 2.2  0.7 - 4.0 (K/uL)    Monocytes Relative 13 (*) 3 - 12 (%)    Monocytes Absolute 1.0  0.1 - 1.0 (K/uL)    Eosinophils Relative 2  0 - 5 (%)    Eosinophils Absolute 0.1  0.0 - 0.7 (K/uL)    Basophils Relative 1  0 - 1 (%)    Basophils Absolute 0.0  0.0 - 0.1 (K/uL)     Assessment    Sickle cell crisis. Acute on chronic pain. Hypokalemia. Rule out narcotic tolerance by history.    Plan    Patient was treated initially in the sickle cell day unit. Patient required PICC line placement for access. He subsequently placed on a PCA Dilaudid pump which is helping his pain. He still not able to manage his  pain at home and will be transitioned to the regular medical floor for further therapy. His potassium will be replenished with oral potassium supplement as well as  IV. Continue PCA Dilaudid pump customize. Followup CBC, CMET in a.m. Consider exchange transfusion.       Nikaya Nasby 03/03/2012, 5:49 PM

## 2012-03-03 NOTE — Procedures (Signed)
US/fluoroscopic guided right DL brachial vein PICC placed. Length 39 cm. TIp SVC/RA junction. No immediate complications.

## 2012-03-04 ENCOUNTER — Inpatient Hospital Stay (HOSPITAL_COMMUNITY): Payer: PRIVATE HEALTH INSURANCE

## 2012-03-04 DIAGNOSIS — N4830 Priapism, unspecified: Secondary | ICD-10-CM

## 2012-03-04 LAB — COMPREHENSIVE METABOLIC PANEL
Alkaline Phosphatase: 66 U/L (ref 39–117)
BUN: 6 mg/dL (ref 6–23)
Calcium: 9.1 mg/dL (ref 8.4–10.5)
Creatinine, Ser: 0.57 mg/dL (ref 0.50–1.35)
GFR calc Af Amer: >90 mL/min (ref >90)
Glucose, Bld: 87 mg/dL (ref 70–99)
Total Protein: 7.2 g/dL (ref 6.0–8.3)

## 2012-03-04 LAB — TYPE AND SCREEN: ABO/RH(D): A POS

## 2012-03-04 LAB — CBC
HCT: 19.3 % — ABNORMAL LOW (ref 39.0–52.0)
MCH: 35.4 pg — ABNORMAL HIGH (ref 26.0–34.0)
MCV: 100.5 fL — ABNORMAL HIGH (ref 78.0–100.0)
Platelets: 445 10*3/uL — ABNORMAL HIGH (ref 150–400)
RDW: 18.1 % — ABNORMAL HIGH (ref 11.5–15.5)

## 2012-03-04 MED ORDER — OXYCODONE HCL 5 MG PO TABS: 10.0000 mg | ORAL_TABLET | ORAL | Status: DC | PRN

## 2012-03-04 MED ORDER — HYDROMORPHONE 0.3 MG/ML IV SOLN
INTRAVENOUS | Status: DC
  Administered 2012-03-04: 12:00:00 via INTRAVENOUS
  Administered 2012-03-04: 5 mg via INTRAVENOUS
  Administered 2012-03-04: 17:00:00 via INTRAVENOUS
  Filled 2012-03-04 (×2): qty 25

## 2012-03-04 MED ORDER — KETOROLAC TROMETHAMINE 30 MG/ML IJ SOLN
30.0000 mg | Freq: Three times a day (TID) | INTRAMUSCULAR | Status: DC
  Administered 2012-03-04: 30 mg via INTRAVENOUS
  Filled 2012-03-04: qty 1

## 2012-03-04 MED ORDER — HEPARIN SOD (PORK) LOCK FLUSH 100 UNIT/ML IV SOLN
INTRAVENOUS | Status: AC
  Filled 2012-03-04: qty 10

## 2012-03-04 MED ORDER — HEPARIN SOD (PORK) LOCK FLUSH 100 UNIT/ML IV SOLN: 500.0000 [IU] | Freq: Once | INTRAVENOUS | Status: DC

## 2012-03-04 MED ORDER — ACETAMINOPHEN 325 MG PO TABS: 650.0000 mg | ORAL_TABLET | Freq: Once | ORAL | Status: DC

## 2012-03-04 MED ORDER — SENNOSIDES-DOCUSATE SODIUM 8.6-50 MG PO TABS
1.0000 | ORAL_TABLET | Freq: Every day | ORAL | Status: DC
  Filled 2012-03-04: qty 1

## 2012-03-04 MED ORDER — HYDROXYUREA 500 MG PO CAPS
1000.0000 mg | ORAL_CAPSULE | Freq: Every day | ORAL | Status: DC
  Administered 2012-03-04: 1000 mg via ORAL
  Filled 2012-03-04: qty 2

## 2012-03-04 MED ORDER — OXYCODONE HCL 15 MG PO TB12
15.0000 mg | ORAL_TABLET | Freq: Two times a day (BID) | ORAL | Status: DC
  Administered 2012-03-04: 15 mg via ORAL
  Filled 2012-03-04: qty 1

## 2012-03-04 MED ORDER — OXYCODONE HCL 5 MG PO CAPS: 10.0000 mg | ORAL_CAPSULE | ORAL | Status: DC | PRN

## 2012-03-04 MED ORDER — TERBUTALINE SULFATE 5 MG PO TABS
5.0000 mg | ORAL_TABLET | Freq: Three times a day (TID) | ORAL | Status: DC
  Administered 2012-03-04: 5 mg via ORAL
  Filled 2012-03-04 (×3): qty 1

## 2012-03-04 MED ORDER — PANTOPRAZOLE SODIUM 40 MG PO TBEC
40.0000 mg | DELAYED_RELEASE_TABLET | Freq: Every day | ORAL | Status: DC
  Administered 2012-03-04: 40 mg via ORAL

## 2012-03-04 MED ORDER — DIPHENHYDRAMINE HCL 50 MG/ML IJ SOLN: 25.0000 mg | Freq: Once | INTRAMUSCULAR | Status: DC

## 2012-03-04 MED ORDER — ACETAMINOPHEN 325 MG PO TABS: 650.0000 mg | ORAL_TABLET | ORAL | Status: DC | PRN

## 2012-03-04 NOTE — Plan of Care (Signed)
Lab called on pt at 11:30, Hemoglobin 6.8. Larina Bras, NP informed at 11:40. New orders written.

## 2012-03-04 NOTE — Progress Notes (Signed)
Subjective: The patient was seen on rounds today.  The patient was sitting quietly in his bed, watching television and talking on the telephone.  The patient is complaining of pain 8/10 in his bilateral UEs and lower back area.  The patient is also complaining of having intermittent wheezing without SOB or a productive cough.  The patient is a recent newlywed and states that when he was in [place] for his wedding he became ill, was hospitalized and was diagnosed with a Staph infection in his blood.  The patient states that this was approximately two weeks ago and his symptoms have lingered ever since.  Within the last week the patient states that his pain has been unrelenting.  The patient also states that he has had intermittent episodes of priapism. The patient's nurse mention that he received in report that medications (Benadryl) was held by the night RN as the patient was excessively drowsy.  I will adjust the PCA settings and start the patient on a long acting analgesic.  Discussed and agreed upon blood exchange x 2 with the patient.   No other nursing or patient concerns.   Objective: Vital signs in last 24 hours: Blood pressure 124/77, pulse 103, temperature 98 F (36.7 C), temperature source Oral, resp. rate 14, height 6\' 1"  (1.854 m), weight 156 lb 1.4 oz (70.8 kg), SpO2 96.00%.  General appearance: Alert, cooperative, well nourished, well developed, mild distress Head: Normocephalic, without obvious abnormality, atraumatic Eyes: PERRLA, EOMI, slight scleral icterus Nose: Nares, septum and mucosa are normal, no drainage or sinus tenderness Throat: Lips, mucosa, tongue, teeth and gums are normal Neck: No adenopathy, supple, symmetrical, trachea midline, thyroid not enlarged, symmetric, no tenderness Back: Symmetric, no curvature, impaired ROM, bilateral CVA tenderness, diffuse tenderness Resp: Clear to auscultation bilaterally, diminished bibasilar breath sounds (RLL > LLL), no  wheezing/rales/rhonchi Cardio: regular rate and rhythm, S1, S2 normal, no murmur, click, rub or gallop GI: Soft, non-tender, hypoactive bowel sounds, no masses,  no organomegaly Male genitalia: deferred, the patient has intermittent priapism Extremities: Extremities normal, atraumatic, no cyanosis, no edema, Homans sign is negative, no sign of DVT, tenderness and limited ROM bilateral UEs Pulses: 2+ and symmetric Skin: Skin color, texture, turgor normal, no rashes or lesions Neurologic: Grossly normal, no focal deficits, CN II - XII intact Psych:  Appropriate affect  Lab Results: Results for orders placed during the hospital encounter of 03/03/12 (from the past 24 hour(s))  COMPREHENSIVE METABOLIC PANEL     Status: Abnormal   Collection Time   03/03/12  2:34 PM      Component Value Range   Sodium 140  135 - 145 (mEq/L)   Potassium 3.0 (*) 3.5 - 5.1 (mEq/L)   Chloride 101  96 - 112 (mEq/L)   CO2 30  19 - 32 (mEq/L)   Glucose, Bld 123 (*) 70 - 99 (mg/dL)   BUN 12  6 - 23 (mg/dL)   Creatinine, Ser 1.61  0.50 - 1.35 (mg/dL)   Calcium 9.0  8.4 - 09.6 (mg/dL)   Total Protein 7.9  6.0 - 8.3 (g/dL)   Albumin 4.1  3.5 - 5.2 (g/dL)   AST 41 (*) 0 - 37 (U/L)   ALT 40  0 - 53 (U/L)   Alkaline Phosphatase 69  39 - 117 (U/L)   Total Bilirubin 1.7 (*) 0.3 - 1.2 (mg/dL)   GFR calc non Af Amer >90  >90 (mL/min)   GFR calc Af Amer >90  >90 (mL/min)  CBC     Status: Abnormal   Collection Time   03/03/12  2:34 PM      Component Value Range   WBC 7.8  4.0 - 10.5 (K/uL)   RBC 2.33 (*) 4.22 - 5.81 (MIL/uL)   Hemoglobin 8.3 (*) 13.0 - 17.0 (g/dL)   HCT 82.9 (*) 56.2 - 52.0 (%)   MCV 100.4 (*) 78.0 - 100.0 (fL)   MCH 35.6 (*) 26.0 - 34.0 (pg)   MCHC 35.5  30.0 - 36.0 (g/dL)   RDW 13.0 (*) 86.5 - 15.5 (%)   Platelets 500 (*) 150 - 400 (K/uL)  DIFFERENTIAL     Status: Abnormal   Collection Time   03/03/12  2:34 PM      Component Value Range   Neutrophils Relative 57  43 - 77 (%)   Neutro Abs 4.5   1.7 - 7.7 (K/uL)   Lymphocytes Relative 28  12 - 46 (%)   Lymphs Abs 2.2  0.7 - 4.0 (K/uL)   Monocytes Relative 13 (*) 3 - 12 (%)   Monocytes Absolute 1.0  0.1 - 1.0 (K/uL)   Eosinophils Relative 2  0 - 5 (%)   Eosinophils Absolute 0.1  0.0 - 0.7 (K/uL)   Basophils Relative 1  0 - 1 (%)   Basophils Absolute 0.0  0.0 - 0.1 (K/uL)    Studies/Results: Ir Fluoro Guide Cv Line Right  03/03/2012  *RADIOLOGY REPORT*  Clinical Data: Sickle cell crisis; central venous access is requested for fluids and medications.  PICC LINE PLACEMENT WITH ULTRASOUND AND FLUOROSCOPIC  GUIDANCE  Fluoroscopy Time: 0.9 minutes.  The right arm was prepped with chlorhexidine, draped in the usual sterile fashion using maximum barrier technique (cap and mask, sterile gown, sterile gloves, large sterile sheet, hand hygiene and cutaneous antisepsis) and infiltrated locally with 1% Lidocaine.  Ultrasound demonstrated patency of the right brachial vein, and this was documented with an image.  Under real-time ultrasound guidance, this vein was accessed with a 21 gauge micropuncture needle and image documentation was performed.  The needle was exchanged over a guidewire for a peel-away sheath through which a 5 [place] double lumen PICC trimmed to 39 cm was advanced, positioned with its tip at the lower SVC/right atrial junction.  Fluoroscopy during the procedure and fluoro spot radiograph confirms appropriate catheter position.  The catheter was flushed, secured to the skin with Prolene sutures, and covered with a sterile dressing.  Complications:  none  IMPRESSION: Successful right arm PICC line placement with ultrasound and fluoroscopic guidance.  The catheter is ready for use.  Read by: Jeananne Rama, P.A.-C  Original Report Authenticated By: Reola Calkins, M.D.     Medications:   Allergies  Allergen Reactions  . Sulfa Antibiotics Hives  . Sulfa Drugs Cross Reactors Hives     Current Facility-Administered Medications    Medication Dose Route Frequency Provider Last Rate Last Dose  . acetaminophen (TYLENOL) tablet 650 mg  650 mg Oral Once Keitha Butte, NP      . acetaminophen (TYLENOL) tablet 650 mg  650 mg Oral Q4H PRN Keitha Butte, NP      . dextrose 5 % and 0.45 % NaCl with KCl 20 mEq/L infusion   Intravenous Continuous Gwenyth Bender, MD 100 mL/hr at 03/03/12 2236    . diphenhydrAMINE (BENADRYL) capsule 25-50 mg  25-50 mg Oral Q4H PRN Gwenyth Bender, MD       Or  . diphenhydrAMINE (BENADRYL)  injection 12.5-25 mg  12.5-25 mg Intravenous Q4H PRN Gwenyth Bender, MD   25 mg at 03/04/12 0936  . diphenhydrAMINE (BENADRYL) injection 25 mg  25 mg Intravenous Once Keitha Butte, NP      . enoxaparin (LOVENOX) injection 40 mg  40 mg Subcutaneous Q24H Gwenyth Bender, MD      . folic acid (FOLVITE) tablet 1 mg  1 mg Oral Daily Gwenyth Bender, MD   1 mg at 03/04/12 1036  . HYDROmorphone (DILAUDID) 4 MG/ML injection        4 mg at 03/03/12 1348  . HYDROmorphone (DILAUDID) PCA injection 0.3 mg/mL   Intravenous Q4H Keitha Butte, NP      . hydroxyurea (HYDREA) capsule 1,000 mg  1,000 mg Oral Daily Keitha Butte, NP   1,000 mg at 03/04/12 1133  . ketorolac (TORADOL) 30 MG/ML injection 30 mg  30 mg Intravenous Q8H Keitha Butte, NP      . naloxone [facility]) injection 0.4 mg  0.4 mg Intravenous PRN Gwenyth Bender, MD       And  . sodium chloride 0.9 % injection 9 mL  9 mL Intravenous PRN Gwenyth Bender, MD      . ondansetron [facility]) tablet 4 mg  4 mg Oral Q4H PRN Gwenyth Bender, MD       Or  . ondansetron [facility]) injection 4 mg  4 mg Intravenous Q4H PRN Gwenyth Bender, MD   4 mg at 03/03/12 2007  . oxyCODONE (Oxy IR/ROXICODONE) immediate release tablet 10 mg  10 mg Oral Q4H PRN Gwenyth Bender, MD      . oxyCODONE (OXYCONTIN) 12 hr tablet 15 mg  15 mg Oral Q12H Keitha Butte, NP      . pantoprazole (PROTONIX) EC tablet 40 mg  40 mg Oral Q1200 Keitha Butte, NP      . potassium chloride SA  (K-DUR,KLOR-CON) CR tablet 40 mEq  40 mEq Oral BID Gwenyth Bender, MD   40 mEq at 03/04/12 1036  . senna-docusate (Senokot-S) tablet 1 tablet  1 tablet Oral QHS Keitha Butte, NP      . terbutaline (BRETHINE) tablet 5 mg  5 mg Oral Q8H Keitha Butte, NP      . DISCONTD: dextrose 5 %-0.45 % sodium chloride infusion   Intravenous Continuous Gwenyth Bender, MD 100 mL/hr at 03/03/12 1759    . DISCONTD: HYDROmorphone (DILAUDID) injection 1 mg  1 mg Intravenous Q4H PRN Gwenyth Bender, MD   1 mg at 03/03/12 2150  . DISCONTD: HYDROmorphone (DILAUDID) injection 2-4 mg  2-4 mg Intravenous Q2H PRN Gwenyth Bender, MD      . DISCONTD: HYDROmorphone (DILAUDID) PCA injection 0.3 mg/mL   Intravenous Q4H Keitha Butte, NP   4 mg at 03/04/12 0850  . DISCONTD: oxycodone (OXY-IR) immediate release capsule 10 mg  10 mg Oral Q4H PRN Keitha Butte, NP        Assessment/Plan: Patient Active Problem List  Diagnoses  . Sickle cell anemia with pain  . Situational stress  . Hypokalemia  . Priapism    Sickle Cell Anemia with Pain:  The patient has a PICC line and a PCA pump.  The patient is ordered to have blood exchange x 2.  The patient will continue pain/nausea/bowel/pruritis management.  CMP, CBC, Hemoglobinopathy, Mg, Phos, ProBNP and Ferritin in the am. CMP, CBC and blood cultures have been ordered. Hypokalemia:  Resolved - The patient will continue receiving KCL in his IVFs  Priapism:  The patient was started on Brethine TID - will continue to monitor  Discussed and agreed upon plan of care with the patient.   The plan of care will be adjusted based on the patient's clinical progress.  **NOTE:  Received call from Care Manager stating patient's insurance wants the patient transferred to [facility] as [facility] is an out of network hospital system for the patient.  Spoke to Leighton Ruff at the [employer] [phone]) who requested that the patient return to [facility] ASAP.  Spoke to the  patient about this and he agreed to the transfer.   Larina Bras, NP-C 03/04/2012, 11:02 AM

## 2012-03-04 NOTE — Discharge Summary (Signed)
Sickle Cell Medical Service Discharge Summary  Patient ID: Justin Sharp MRN: [id number] DOB/AGE: 1986/03/24 26 y.o.  Admit date: 03/03/2012 Discharge date: 03/04/2012  Admission Diagnoses:  Discharge Diagnoses:  Active Problems:  Priapism   Discharged Condition: Stable  Hospital Course:  Please refer to admission H& P for information regarding this patient's admission. In short, the patient was D/C'd from [facility] yesterday and presented to the [facility] yesterday afternoon stating that he had myalgias like a sickle cell crisis.  The patient was subsequently admitted to the hospital for sickle cell crisis and hypokalemia.  We received a call today from the patient's insurance company Leighton Ruff  [phone]) who requested that the patient return to [facility] ASAP. Spoke to the patient about this and he agreed to the transfer.  The patient was going to receive a blood transfusion and blood exchange while hospitalized at [facility] as his Hgb is 6.8, but this was not completed as the patient has blood that is difficult to match.  Our laboratory did call the [facility] laboratory and relay the lab findings of the type/screen to them.  The patient will be transferred at this time to [facility] in stable condition at the request of his insurance company.   Consults: None  Significant Diagnostic Studies:  Results for orders placed during the hospital encounter of 03/03/12 (from the past 24 hour(s))  TYPE AND SCREEN     Status: Normal   Collection Time   03/04/12 11:30 AM      Component Value Range   ABO/RH(D) A POS     Antibody Screen POS     Sample Expiration 03/07/2012     Antibody Identification ANTI-E WARM AUTOANTIBODY     PT AG Type       Value: NEGATIVE FOR C ANTIGEN POSITIVE FOR c ANTIGEN NEGATIVE FOR E ANTIGEN POSITIVE FOR e ANTIGEN NEGATIVE FOR KELL ANTIGEN   DAT, IgG POS    PREPARE RBC  (CROSSMATCH)     Status: Normal   Collection Time   03/04/12 11:30 AM      Component Value Range   Order Confirmation ORDER PROCESSED BY BLOOD BANK    COMPREHENSIVE METABOLIC PANEL     Status: Abnormal   Collection Time   03/04/12 11:30 AM      Component Value Range   Sodium 141  135 - 145 (mEq/L)   Potassium 4.0  3.5 - 5.1 (mEq/L)   Chloride 104  96 - 112 (mEq/L)   CO2 28  19 - 32 (mEq/L)   Glucose, Bld 87  70 - 99 (mg/dL)   BUN 6  6 - 23 (mg/dL)   Creatinine, Ser 8.29  0.50 - 1.35 (mg/dL)   Calcium 9.1  8.4 - 56.2 (mg/dL)   Total Protein 7.2  6.0 - 8.3 (g/dL)   Albumin 3.7  3.5 - 5.2 (g/dL)   AST 41 (*) 0 - 37 (U/L)   ALT 39  0 - 53 (U/L)   Alkaline Phosphatase 66  39 - 117 (U/L)   Total Bilirubin 1.4 (*) 0.3 - 1.2 (mg/dL)   GFR calc non Af Amer >90  >90 (mL/min)   GFR calc Af Amer >90  >90 (mL/min)  CBC     Status: Abnormal   Collection Time   03/04/12 11:30 AM      Component Value Range   WBC 8.7  4.0 - 10.5 (K/uL)   RBC 1.92 (*) 4.22 -  5.81 (MIL/uL)   Hemoglobin 6.8 (*) 13.0 - 17.0 (g/dL)   HCT 16.1 (*) 09.6 - 52.0 (%)   MCV 100.5 (*) 78.0 - 100.0 (fL)   MCH 35.4 (*) 26.0 - 34.0 (pg)   MCHC 35.2  30.0 - 36.0 (g/dL)   RDW 04.5 (*) 40.9 - 15.5 (%)   Platelets 445 (*) 150 - 400 (K/uL)  ABO/RH     Status: Normal   Collection Time   03/04/12 11:30 AM      Component Value Range   ABO/RH(D) A POS     Dg Chest 2 View 03/04/2012  *RADIOLOGY REPORT*  Clinical Data: Baseline for admission, history sickle cell disease  CHEST - 2 VIEW  Comparison: Chest radiograph 12/28/2011  Findings: The heart silhouette is mildly enlarged.  No effusion, infiltrate, or pneumothorax.  Right PICC line with tip in distal SVC  IMPRESSION:  Borderline cardiomegaly.  No infiltrate or infarction.  Original Report Authenticated By: Genevive Bi, M.D.   Ir Fluoro Guide Cv Line Right 03/03/2012  *RADIOLOGY REPORT*  Clinical Data: Sickle cell crisis; central venous access is requested for fluids and  medications.  PICC LINE PLACEMENT WITH ULTRASOUND AND FLUOROSCOPIC  GUIDANCE  Fluoroscopy Time: 0.9 minutes.  The right arm was prepped with chlorhexidine, draped in the usual sterile fashion using maximum barrier technique (cap and mask, sterile gown, sterile gloves, large sterile sheet, hand hygiene and cutaneous antisepsis) and infiltrated locally with 1% Lidocaine.  Ultrasound demonstrated patency of the right brachial vein, and this was documented with an image.  Under real-time ultrasound guidance, this vein was accessed with a 21 gauge micropuncture needle and image documentation was performed.  The needle was exchanged over a guidewire for a peel-away sheath through which a 5 [place] double lumen PICC trimmed to 39 cm was advanced, positioned with its tip at the lower SVC/right atrial junction.  Fluoroscopy during the procedure and fluoro spot radiograph confirms appropriate catheter position.  The catheter was flushed, secured to the skin with Prolene sutures, and covered with a sterile dressing.  Complications:  none  IMPRESSION: Successful right arm PICC line placement with ultrasound and fluoroscopic guidance.  The catheter is ready for use.  Read by: Jeananne Rama, P.A.-C  Original Report Authenticated By: Reola Calkins, M.D.   Discharge Exam: Blood pressure 108/72, pulse 112, temperature 98.9 F (37.2 C), temperature source Oral, resp. rate 14, height 6\' 1"  (1.854 m), weight 156 lb 1.4 oz (70.8 kg), SpO2 96.00%.  General appearance: Alert, cooperative, well nourished, well developed, mild distress  Head: Normocephalic, without obvious abnormality, atraumatic  Eyes: PERRLA, EOMI, slight scleral icterus  Nose: Nares, septum and mucosa are normal, no drainage or sinus tenderness  Throat: Lips, mucosa, tongue, teeth and gums are normal  Neck: No adenopathy, supple, symmetrical, trachea midline, thyroid not enlarged, symmetric, no tenderness  Back: Symmetric, no curvature, impaired ROM,  bilateral CVA tenderness, diffuse tenderness  Resp: Clear to auscultation bilaterally, diminished bibasilar breath sounds (RLL > LLL), no wheezing/rales/rhonchi  Cardio: regular rate and rhythm, S1, S2 normal, no murmur, click, rub or gallop  GI: Soft, non-tender, hypoactive bowel sounds, no masses, no organomegaly  Male genitalia: deferred, the patient has intermittent priapism  Extremities: Extremities normal, atraumatic, no cyanosis, no edema, Homans sign is negative, no sign of DVT, tenderness and limited ROM bilateral UEs  Pulses: 2+ and symmetric  Skin: Skin color, texture, turgor normal, no rashes or lesions  Neurologic: Grossly normal, no focal deficits, CN II -  XII intact  Psych: Appropriate affect  Disposition: Transfer to [facility]  Discharge Orders    Future Orders Please Complete By Expires   Increase activity slowly        Medication List  As of 03/04/2012  5:27 PM   TAKE these medications         folic acid 1 MG tablet   Commonly known as: FOLVITE   Take 1 mg by mouth daily.      HYDROmorphone 8 MG tablet   Commonly known as: DILAUDID   Take 8 mg by mouth every 4 (four) hours as needed.      hydroxyurea 500 MG capsule   Commonly known as: HYDREA   Take 1,000 mg by mouth daily. May take with food to minimize GI side effects.      mulitivitamin with minerals Tabs   Take 1 tablet by mouth daily.      oxycodone 5 MG capsule   Commonly known as: OXY-IR   Take 2 capsules (10 mg total) by mouth every 4 (four) hours as needed. For pain      potassium chloride 10 MEQ tablet   Commonly known as: K-DUR   Take 2 tablets (20 mEq total) by mouth daily.             SignedLarina Bras NP-C 03/04/2012, 5:27 PM

## 2012-03-10 LAB — CULTURE, BLOOD (ROUTINE X 2)
Culture  Setup Time: 201305161419
Culture  Setup Time: 201305161420
Culture: NO GROWTH

## 2012-03-18 ENCOUNTER — Encounter (HOSPITAL_COMMUNITY): Payer: Self-pay | Admitting: Emergency Medicine

## 2012-03-18 ENCOUNTER — Inpatient Hospital Stay (HOSPITAL_COMMUNITY)
Admission: EM | Admit: 2012-03-18 | Discharge: 2012-03-23 | DRG: 811 | Disposition: A | Discharge status: Home / Self Care | Payer: PRIVATE HEALTH INSURANCE | Attending: Internal Medicine | Admitting: Internal Medicine

## 2012-03-18 ENCOUNTER — Emergency Department (HOSPITAL_COMMUNITY): Payer: PRIVATE HEALTH INSURANCE

## 2012-03-18 DIAGNOSIS — J189 Pneumonia, unspecified organism: Secondary | ICD-10-CM | POA: Diagnosis present

## 2012-03-18 DIAGNOSIS — E876 Hypokalemia: Secondary | ICD-10-CM | POA: Diagnosis present

## 2012-03-18 DIAGNOSIS — R079 Chest pain, unspecified: Secondary | ICD-10-CM | POA: Diagnosis present

## 2012-03-18 DIAGNOSIS — D5700 Hb-SS disease with crisis, unspecified: Secondary | ICD-10-CM

## 2012-03-18 DIAGNOSIS — D5701 Hb-SS disease with acute chest syndrome: Secondary | ICD-10-CM | POA: Diagnosis present

## 2012-03-18 DIAGNOSIS — M79609 Pain in unspecified limb: Secondary | ICD-10-CM | POA: Diagnosis present

## 2012-03-18 DIAGNOSIS — J069 Acute upper respiratory infection, unspecified: Secondary | ICD-10-CM | POA: Diagnosis present

## 2012-03-18 DIAGNOSIS — M549 Dorsalgia, unspecified: Secondary | ICD-10-CM | POA: Diagnosis present

## 2012-03-18 LAB — DIFFERENTIAL
Basophils Relative: 0 % (ref 0–1)
Eosinophils Absolute: 0 10*3/uL (ref 0.0–0.7)
Lymphs Abs: 1.3 10*3/uL (ref 0.7–4.0)
Monocytes Absolute: 1.1 10*3/uL — ABNORMAL HIGH (ref 0.1–1.0)
Neutrophils Relative %: 74 % (ref 43–77)

## 2012-03-18 LAB — RETICULOCYTES
RBC.: 2.63 MIL/uL — ABNORMAL LOW (ref 4.22–5.81)
Retic Ct Pct: 22.4 % — ABNORMAL HIGH (ref 0.4–3.1)

## 2012-03-18 LAB — COMPREHENSIVE METABOLIC PANEL
Albumin: 3.7 g/dL (ref 3.5–5.2)
Alkaline Phosphatase: 68 U/L (ref 39–117)
BUN: 6 mg/dL (ref 6–23)
CO2: 26 mEq/L (ref 19–32)
Chloride: 102 mEq/L (ref 96–112)
Creatinine, Ser: 0.53 mg/dL (ref 0.50–1.35)
GFR calc Af Amer: >90 mL/min (ref >90)
GFR calc non Af Amer: >90 mL/min (ref >90)
Glucose, Bld: 96 mg/dL (ref 70–99)
Potassium: 2.8 mEq/L — ABNORMAL LOW (ref 3.5–5.1)
Total Bilirubin: 1.1 mg/dL (ref 0.3–1.2)

## 2012-03-18 LAB — CBC
Hemoglobin: 8.6 g/dL — ABNORMAL LOW (ref 13.0–17.0)
MCH: 32.7 pg (ref 26.0–34.0)
MCHC: 34.3 g/dL (ref 30.0–36.0)
MCV: 95.4 fL (ref 78.0–100.0)

## 2012-03-18 MED ORDER — SODIUM CHLORIDE 0.9 % IV BOLUS (SEPSIS)
1000.0000 mL | Freq: Once | INTRAVENOUS | Status: AC
  Administered 2012-03-18: 1000 mL via INTRAVENOUS

## 2012-03-18 MED ORDER — HYDROMORPHONE HCL PF 2 MG/ML IJ SOLN
2.0000 mg | Freq: Once | INTRAMUSCULAR | Status: AC
  Administered 2012-03-18: 2 mg via INTRAVENOUS
  Filled 2012-03-18: qty 1

## 2012-03-18 MED ORDER — POTASSIUM CHLORIDE CRYS ER 20 MEQ PO TBCR
40.0000 meq | EXTENDED_RELEASE_TABLET | Freq: Once | ORAL | Status: AC
  Administered 2012-03-18: 40 meq via ORAL
  Filled 2012-03-18: qty 2

## 2012-03-18 MED ORDER — DIPHENHYDRAMINE HCL 50 MG/ML IJ SOLN
25.0000 mg | Freq: Once | INTRAMUSCULAR | Status: AC
  Administered 2012-03-18: 25 mg via INTRAVENOUS
  Filled 2012-03-18: qty 1

## 2012-03-18 MED ORDER — SODIUM CHLORIDE 0.9 % IV SOLN: INTRAVENOUS | Status: DC

## 2012-03-18 NOTE — ED Notes (Signed)
Pt presented to the ER with c/o sickle cell crisis, pain located to the chest, pt called clinic, was told no room at this time.

## 2012-03-18 NOTE — ED Notes (Signed)
First contact with pt, states sickle cell crisis. Rates pain 9/10 in chest for 3 days. Pt states went to sickle cell clinic first but they stated they are full and recommended coming to ER. Pt on 2L [place] o2.

## 2012-03-18 NOTE — ED Provider Notes (Signed)
History     CSN: [id number]  Arrival date & time 03/18/12  1846   First MD Initiated Contact with Patient 03/18/12 1943      Chief Complaint  Patient presents with  . Sickle Cell Pain Crisis    (Consider location/radiation/quality/duration/timing/severity/associated sxs/prior treatment) HPI  Patient here with anterior chest pain which he associates with his sickle cell disease. Pain has been present for 3 days. Pain is anterior and diffuse and sharp in nature. Pain is 10 out of 10. Patient states that he called the sickle cell clinic and was told that they were full. He presents to the emergency department secondary to this. He states he has a fever up to 100.4 intermittently over the past 3 days. He has mild dyspnea. He states that he has had this is his presentation of sickle cell pain in the past. He is a patient of Dr. August Saucer. He does not know his baseline hemoglobin.  Past Medical History  Diagnosis Date  . Sickle cell anemia     Past Surgical History  Procedure Date  . Appendectomy     Family History  Problem Relation Age of Onset  . Diabetes Other   . Coronary artery disease Other     History  Substance Use Topics  . Smoking status: Never Smoker   . Smokeless tobacco: Not on file  . Alcohol Use: No      Review of Systems  All other systems reviewed and are negative.    Allergies  Sulfa antibiotics and Sulfa drugs cross reactors  Home Medications   Current Outpatient Rx  Name Route Sig Dispense Refill  . FOLIC ACID 1 MG PO TABS Oral Take 1 mg by mouth daily.     Marland Kitchen HYDROMORPHONE HCL 8 MG PO TABS Oral Take 8 mg by mouth every 4 (four) hours as needed.     Marland Kitchen HYDROXYUREA 500 MG PO CAPS Oral Take 1,000 mg by mouth daily. May take with food to minimize GI side effects.     . ADULT MULTIVITAMIN W/MINERALS CH Oral Take 1 tablet by mouth daily.    . OXYCODONE HCL 5 MG PO CAPS Oral Take 2 capsules (10 mg total) by mouth every 4 (four) hours as needed. For pain  60 capsule 0  . POTASSIUM CHLORIDE ER 10 MEQ PO TBCR Oral Take 2 tablets (20 mEq total) by mouth daily. 30 tablet 0    BP 112/64  Pulse 64  Temp(Src) 99.1 F (37.3 C) (Oral)  Resp 20  SpO2 100%  Physical Exam  Nursing note and vitals reviewed. Constitutional: He is oriented to person, place, and time. He appears well-developed and well-nourished.  HENT:  Head: Normocephalic and atraumatic.  Right Ear: External ear normal.  Left Ear: External ear normal.  Nose: Nose normal.  Mouth/Throat: Oropharynx is clear and moist.  Eyes: EOM are normal. Pupils are equal, round, and reactive to light.       Conjunctivae pale  Neck: Normal range of motion. Neck supple.  Cardiovascular: Normal rate, regular rhythm, normal heart sounds and intact distal pulses.   Pulmonary/Chest: Effort normal and breath sounds normal.  Abdominal: Soft. Bowel sounds are normal.  Musculoskeletal: Normal range of motion.  Neurological: He is alert and oriented to person, place, and time. He has normal reflexes.  Skin: Skin is warm and dry.  Psychiatric: He has a normal mood and affect. His behavior is normal. Thought content normal.    ED Course  Procedures (including critical care  time)   Labs Reviewed  CBC  DIFFERENTIAL  COMPREHENSIVE METABOLIC PANEL  RETICULOCYTES   No results found.   No diagnosis found.   Date: 03/18/2012  Rate: 52  Rhythm: sinus bradycardia  QRS Axis: normal  Intervals: normal  ST/T Wave abnormalities: normal  Conduction Disutrbances:none  Narrative Interpretation:   Old EKG Reviewed: changes noted    MDM  Patient received 2 L normal saline and 6 mg of Dilaudid. He states his pain is still severe. Plan admission for sickle cell pain crisis the       Hilario Quarry, MD 03/18/12 2325

## 2012-03-18 NOTE — ED Notes (Signed)
Pt presenting to ed with c/o sickle cell crisis x 3 days.

## 2012-03-18 NOTE — ED Notes (Signed)
Return

## 2012-03-18 NOTE — ED Notes (Signed)
Patient transported to X-ray 

## 2012-03-19 ENCOUNTER — Encounter (HOSPITAL_COMMUNITY): Payer: Self-pay | Admitting: Internal Medicine

## 2012-03-19 DIAGNOSIS — R079 Chest pain, unspecified: Secondary | ICD-10-CM | POA: Diagnosis present

## 2012-03-19 LAB — CBC
HCT: 25.7 % — ABNORMAL LOW (ref 39.0–52.0)
MCHC: 33.9 g/dL (ref 30.0–36.0)
RDW: 25.5 % — ABNORMAL HIGH (ref 11.5–15.5)

## 2012-03-19 LAB — BASIC METABOLIC PANEL
BUN: 5 mg/dL — ABNORMAL LOW (ref 6–23)
Calcium: 8.1 mg/dL — ABNORMAL LOW (ref 8.4–10.5)
Chloride: 107 mEq/L (ref 96–112)
Creatinine, Ser: 0.5 mg/dL (ref 0.50–1.35)
GFR calc Af Amer: >90 mL/min (ref >90)
GFR calc non Af Amer: >90 mL/min (ref >90)

## 2012-03-19 LAB — CARDIAC PANEL(CRET KIN+CKTOT+MB+TROPI)
CK, MB: 0.9 ng/mL (ref 0.3–4.0)
CK, MB: 1 ng/mL (ref 0.3–4.0)
CK, MB: 1 ng/mL (ref 0.3–4.0)
Total CK: 29 U/L (ref 7–232)

## 2012-03-19 MED ORDER — FOLIC ACID 1 MG PO TABS
1.0000 mg | ORAL_TABLET | Freq: Every day | ORAL | Status: DC
  Administered 2012-03-19 – 2012-03-23 (×5): 1 mg via ORAL
  Filled 2012-03-19 (×5): qty 1

## 2012-03-19 MED ORDER — DIPHENHYDRAMINE HCL 25 MG PO CAPS: 25.0000 mg | ORAL_CAPSULE | Freq: Four times a day (QID) | ORAL | Status: DC | PRN

## 2012-03-19 MED ORDER — HYDROMORPHONE HCL PF 1 MG/ML IJ SOLN
1.0000 mg | INTRAMUSCULAR | Status: DC | PRN
  Administered 2012-03-19 – 2012-03-21 (×22): 2 mg via INTRAVENOUS
  Administered 2012-03-22 (×2): 1 mg via INTRAVENOUS
  Administered 2012-03-22 – 2012-03-23 (×15): 2 mg via INTRAVENOUS
  Filled 2012-03-19 (×4): qty 2
  Filled 2012-03-19: qty 1
  Filled 2012-03-19 (×24): qty 2
  Filled 2012-03-19: qty 1
  Filled 2012-03-19 (×10): qty 2

## 2012-03-19 MED ORDER — MOXIFLOXACIN HCL IN NACL 400 MG/250ML IV SOLN
400.0000 mg | INTRAVENOUS | Status: DC
  Administered 2012-03-19 – 2012-03-21 (×3): 400 mg via INTRAVENOUS
  Filled 2012-03-19 (×3): qty 250

## 2012-03-19 MED ORDER — ALBUTEROL SULFATE (5 MG/ML) 0.5% IN NEBU: 2.5000 mg | INHALATION_SOLUTION | RESPIRATORY_TRACT | Status: DC | PRN

## 2012-03-19 MED ORDER — POTASSIUM CHLORIDE 2 MEQ/ML IV SOLN
INTRAVENOUS | Status: DC
  Administered 2012-03-19 – 2012-03-23 (×8): via INTRAVENOUS
  Filled 2012-03-19 (×18): qty 1000

## 2012-03-19 MED ORDER — ONDANSETRON HCL 4 MG/2ML IJ SOLN: 4.0000 mg | Freq: Four times a day (QID) | INTRAMUSCULAR | Status: DC | PRN

## 2012-03-19 MED ORDER — ONDANSETRON HCL 4 MG PO TABS: 4.0000 mg | ORAL_TABLET | Freq: Four times a day (QID) | ORAL | Status: DC | PRN

## 2012-03-19 MED ORDER — HYDROXYUREA 500 MG PO CAPS
1000.0000 mg | ORAL_CAPSULE | Freq: Every day | ORAL | Status: DC
  Administered 2012-03-19 – 2012-03-23 (×5): 1000 mg via ORAL
  Filled 2012-03-19 (×5): qty 2

## 2012-03-19 MED ORDER — ASPIRIN EC 81 MG PO TBEC
81.0000 mg | DELAYED_RELEASE_TABLET | Freq: Every day | ORAL | Status: DC
  Administered 2012-03-19 – 2012-03-23 (×5): 81 mg via ORAL
  Filled 2012-03-19 (×5): qty 1

## 2012-03-19 MED ORDER — ADULT MULTIVITAMIN W/MINERALS CH
1.0000 | ORAL_TABLET | Freq: Every day | ORAL | Status: DC
  Administered 2012-03-19 – 2012-03-23 (×5): 1 via ORAL
  Filled 2012-03-19 (×5): qty 1

## 2012-03-19 MED ORDER — SODIUM CHLORIDE 0.9 % IJ SOLN
3.0000 mL | Freq: Two times a day (BID) | INTRAMUSCULAR | Status: DC
  Administered 2012-03-19 – 2012-03-22 (×4): 3 mL via INTRAVENOUS

## 2012-03-19 MED ORDER — HYDROMORPHONE HCL 2 MG PO TABS
2.0000 mg | ORAL_TABLET | ORAL | Status: DC | PRN
  Administered 2012-03-19 – 2012-03-23 (×5): 2 mg via ORAL
  Filled 2012-03-19 (×5): qty 1

## 2012-03-19 MED ORDER — POTASSIUM CHLORIDE 10 MEQ/100ML IV SOLN
10.0000 meq | INTRAVENOUS | Status: AC
  Administered 2012-03-19 (×5): 10 meq via INTRAVENOUS
  Filled 2012-03-19 (×7): qty 100

## 2012-03-19 MED ORDER — DEXTROSE 5 % IV SOLN
1.0000 g | INTRAVENOUS | Status: DC
  Administered 2012-03-19 – 2012-03-23 (×5): 1 g via INTRAVENOUS
  Filled 2012-03-19 (×5): qty 10

## 2012-03-19 MED ORDER — HYDROMORPHONE HCL PF 1 MG/ML IJ SOLN
1.0000 mg | INTRAMUSCULAR | Status: DC | PRN
  Administered 2012-03-19 (×5): 2 mg via INTRAVENOUS
  Filled 2012-03-19 (×5): qty 2

## 2012-03-19 MED ORDER — OXYCODONE HCL 5 MG PO TABS: 10.0000 mg | ORAL_TABLET | ORAL | Status: DC | PRN

## 2012-03-19 MED ORDER — POTASSIUM CHLORIDE IN NACL 40-0.9 MEQ/L-% IV SOLN
INTRAVENOUS | Status: DC
  Administered 2012-03-19 (×2): via INTRAVENOUS
  Filled 2012-03-19 (×3): qty 1000

## 2012-03-19 MED ORDER — ALBUTEROL SULFATE (5 MG/ML) 0.5% IN NEBU
2.5000 mg | INHALATION_SOLUTION | Freq: Four times a day (QID) | RESPIRATORY_TRACT | Status: DC
  Administered 2012-03-19 – 2012-03-20 (×6): 2.5 mg via RESPIRATORY_TRACT
  Filled 2012-03-19 (×6): qty 0.5

## 2012-03-19 MED ORDER — POTASSIUM CHLORIDE ER 10 MEQ PO TBCR
20.0000 meq | EXTENDED_RELEASE_TABLET | Freq: Every day | ORAL | Status: DC
  Administered 2012-03-19 – 2012-03-23 (×5): 20 meq via ORAL
  Filled 2012-03-19 (×5): qty 2

## 2012-03-19 NOTE — H&P (Signed)
PCP:   HALPERN,DAVID, MD, MD   Chief Complaint: Sickle cell crisis   HPI: Justin Sharp is an 26 y.o. male with history of sickle cell disease, and the care of Dr. Willey Blade, presents to [facility] long emergency room complaining of chest pain, back and leg pain, consistent with his usual sickle cell disease crisis. He denied fever, chills, productive cough, abdominal cramps or pain. Evaluation in the emergency room included hemoglobin of 8.6 g per decaliter, normal white count 10.5 thousand, and potassium of 2.8 milliequivalents per liter. Chest x-ray show bilateral opacification in both bases. These are new. He also has toxic granulation in his differential. Hospitalist was asked to admit him for sickle cell crisis   Review of system : The patient denies anorexia, fever, weight loss,, vision loss, decreased hearing, hoarseness, , syncope, dyspnea on exertion, peripheral edema, balance deficits, hemoptysis, abdominal pain, melena, hematochezia, severe indigestion/heartburn, hematuria, incontinence, genital sores, muscle weakness, suspicious skin lesions, transient blindness, difficulty walking, depression, unusual weight change, abnormal bleeding, enlarged lymph nodes, angioedema, and breast masses.    Past Medical History  Diagnosis Date  . Sickle cell anemia     Past Surgical History  Procedure Date  . Appendectomy     Medications:  HOME MEDS: Prior to Admission medications   Medication Sig Start Date End Date Taking? Authorizing Provider  folic acid (FOLVITE) 1 MG tablet Take 1 mg by mouth daily.    Yes Historical Provider, MD  HYDROmorphone (DILAUDID) 8 MG tablet Take 8 mg by mouth every 4 (four) hours as needed.    Yes Historical Provider, MD  hydroxyurea (HYDREA) 500 MG capsule Take 1,000 mg by mouth daily. May take with food to minimize GI side effects.    Yes Historical Provider, MD  Multiple Vitamin (MULITIVITAMIN WITH MINERALS) TABS Take 1 tablet by mouth daily.   Yes  Historical Provider, MD  oxycodone (OXY-IR) 5 MG capsule Take 2 capsules (10 mg total) by mouth every 4 (four) hours as needed. For pain 01/20/12  Yes Keitha Butte, NP  potassium chloride (K-DUR) 10 MEQ tablet Take 2 tablets (20 mEq total) by mouth daily. 01/14/12 01/13/13 Yes Gwenyth Bender, MD     Allergies:  Allergies  Allergen Reactions  . Sulfa Antibiotics Hives  . Sulfa Drugs Cross Reactors Hives    Social History:   reports that he has never smoked. He does not have any smokeless tobacco history on file. He reports that he does not drink alcohol or use illicit drugs.  Family History: Family History  Problem Relation Age of Onset  . Diabetes Other   . Coronary artery disease Other      Physical Exam: Filed Vitals:   03/18/12 1954 03/18/12 2150 03/18/12 2256 03/18/12 2300  BP: 115/66 120/61 119/61 113/65  Pulse: 53 56 61 51  Temp:      TempSrc:      Resp: 16 14 14 14   SpO2: 100% 100% 100% 100%   Blood pressure 113/65, pulse 51, temperature 99.1 F (37.3 C), temperature source Oral, resp. rate 14, SpO2 100.00%.  GEN:  Pleasant  person lying in the stretcher in no acute distress; cooperative with exam PSYCH:  alert and oriented x4; does not appear anxious or depressed; affect is appropriate. HEENT: Mucous membranes pink and anicteric; PERRLA; EOM intact; no cervical lymphadenopathy nor thyromegaly or carotid bruit; no JVD; Breasts:: Not examined CHEST WALL: No tenderness CHEST: Normal respiration,  his lung exam shows scattered rhonchi throughout  HEART:  Regular rate and rhythm; no murmurs rubs or gallops BACK: No kyphosis or scoliosis; no CVA tenderness ABDOMEN: Obese, soft, diffusely tender; no masses, no organomegaly, normal abdominal bowel sounds; no pannus; no intertriginous candida. Rectal Exam: Not done EXTREMITIES: No bone or joint deformity; age-appropriate arthropathy of the hands and knees; no edema; no ulcerations. Genitalia: not examined PULSES: 2+ and  symmetric SKIN: Normal hydration no rash or ulceration CNS: Cranial nerves 2-12 grossly intact no focal lateralizing neurologic deficit   Labs & Imaging Results for orders placed during the hospital encounter of 03/18/12 (from the past 48 hour(s))  CBC     Status: Abnormal   Collection Time   03/18/12  8:43 PM      Component Value Range Comment   WBC 9.5  4.0 - 10.5 (K/uL)    RBC 2.63 (*) 4.22 - 5.81 (MIL/uL)    Hemoglobin 8.6 (*) 13.0 - 17.0 (g/dL)    HCT 16.1 (*) 09.6 - 52.0 (%)    MCV 95.4  78.0 - 100.0 (fL)    MCH 32.7  26.0 - 34.0 (pg)    MCHC 34.3  30.0 - 36.0 (g/dL)    RDW 04.5 (*) 40.9 - 15.5 (%)    Platelets 769 (*) 150 - 400 (K/uL) PLATELET COUNT CONFIRMED BY SMEAR  DIFFERENTIAL     Status: Abnormal   Collection Time   03/18/12  8:43 PM      Component Value Range Comment   Neutrophils Relative 74  43 - 77 (%)    Lymphocytes Relative 14  12 - 46 (%)    Monocytes Relative 12  3 - 12 (%)    Eosinophils Relative 0  0 - 5 (%)    Basophils Relative 0  0 - 1 (%)    Neutro Abs 7.1  1.7 - 7.7 (K/uL)    Lymphs Abs 1.3  0.7 - 4.0 (K/uL)    Monocytes Absolute 1.1 (*) 0.1 - 1.0 (K/uL)    Eosinophils Absolute 0.0  0.0 - 0.7 (K/uL)    Basophils Absolute 0.0  0.0 - 0.1 (K/uL)    RBC Morphology MARKED POLYCHROMASIA      WBC Morphology TOXIC GRANULATION      Smear Review LARGE PLATELETS PRESENT     COMPREHENSIVE METABOLIC PANEL     Status: Abnormal   Collection Time   03/18/12  8:43 PM      Component Value Range Comment   Sodium 142  135 - 145 (mEq/L)    Potassium 2.8 (*) 3.5 - 5.1 (mEq/L)    Chloride 102  96 - 112 (mEq/L)    CO2 26  19 - 32 (mEq/L)    Glucose, Bld 96  70 - 99 (mg/dL)    BUN 6  6 - 23 (mg/dL)    Creatinine, Ser 8.11  0.50 - 1.35 (mg/dL)    Calcium 9.2  8.4 - 10.5 (mg/dL)    Total Protein 7.6  6.0 - 8.3 (g/dL)    Albumin 3.7  3.5 - 5.2 (g/dL)    AST 26  0 - 37 (U/L)    ALT 30  0 - 53 (U/L)    Alkaline Phosphatase 68  39 - 117 (U/L)    Total Bilirubin 1.1  0.3  - 1.2 (mg/dL)    GFR calc non Af Amer >90  >90 (mL/min)    GFR calc Af Amer >90  >90 (mL/min)   RETICULOCYTES     Status: Abnormal   Collection Time  03/18/12  8:43 PM      Component Value Range Comment   Retic Ct Pct 22.4 (*) 0.4 - 3.1 (%) RESULTS CONFIRMED BY MANUAL DILUTION   RBC. 2.63 (*) 4.22 - 5.81 (MIL/uL)    Retic Count, Manual 589.1 (*) 19.0 - 186.0 (K/uL)    Dg Chest 2 View  03/18/2012  *RADIOLOGY REPORT*  Clinical Data: Chest pain for several days.  Sickle cell disease.  CHEST - 2 VIEW  Comparison: 03/04/2012  Findings: Mild cardiac enlargement.  Ill-defined lower lobe opacities peripherally were not present previously and could represent atelectasis, early infiltrate, or edema.  There is no effusion or pneumothorax.  Osseous structures unremarkable.  IMPRESSION: Bilateral ill-defined lower lobe opacities peripherally not present previously on 03/04/2012.  Early subsegmental atelectasis or infiltrates are possible. Cardiomegaly.  Original Report Authenticated By: Elsie Stain, M.D.      Assessment Present on Admission:  .Sickle cell anemia with pain .Hypokalemia .Chest pain syndrome Upper respiratory infection   PLAN:We'll admit him to Dr. Diamantina [facility] service. Because of the toxic granulation and the ill-defined lower lobe opacity, I will start him on Avelox for possible early pneumonia. He has profound hypokalemia and will need aggressive repletion. We'll also cycle his cardiac markers. He will be given the usual IV fluid, pain medication, and antiemetics. I will resume his home medication.   Other plans as per orders.    Romonia Yanik 03/19/2012, 12:06 AM

## 2012-03-19 NOTE — Progress Notes (Signed)
PROGRESS NOTE  Justin Sharp ZOX:[id number] DOB: August 10, 1986 DOA: 03/18/2012 PCP: Fonnie Mu, MD, MD  Brief narrative: Justin Sharp is an 25 y.o. African American  male with history of sickle cell disease,, presents to [facility] long emergency room complaining of chest pain, back and leg pain. Left AMA on 03/18/12 from [facility] review d/c summary.    Assessment/Plan: Principal Problem:  *Sickle cell anemia with pain IVF, pain management , laboratory data review  Active Problems:  Hypokalemia replacement   Chest pain syndrome PO pain medication   Code Status: Full Family Communication: None Disposition Plan:  Transfer to [facility] when bed available   Medical Consultants:  None  Other consultants:    Antibiotics:  Rocephin        Avelox   Subjective  Justin Sharp per report from [facility] MD left AMA on 03/18/12 . Today he c/o pain in chest no relieved by po pain medication. Reports pain 9/10. Explain was trying to provide continuity of care and previous MD made suggestions plan to transfer [facility] hospital when bed available.     Objective    Interim History: Stable over night    Objective: Filed Vitals:   03/19/12 1402 03/19/12 1432 03/19/12 1935 03/19/12 2108  BP: 117/73   115/70  Pulse: 76   78  Temp: 99.5 F (37.5 C)   99.1 F (37.3 C)  TempSrc: Oral   Oral  Resp: 17   18  Height:      Weight:      SpO2: 97% 99% 98% 98%    Intake/Output Summary (Last 24 hours) at 03/19/12 2355 Last data filed at 03/19/12 2215  Gross per 24 hour  Intake   1683 ml  Output   2200 ml  Net   -517 ml    Exam: General Appearance: Alert and oriented, well developed, well, nourished, no acute distress (multiple tattoo) Head: Normocephalic, atraumatic  Eyes: PERRLA, EOMI, scleral icterus  Nose: Nares, septum and mucosa are normal, no drainage or sinus tenderness  Throat: Lips, mucosa, and tongue normal, dentition good Neck: No adenopathy, supple, symmetrical, trachea midline  Resp:  CTA bilaterally, diminished bases, /rales/rhonchi, shortness of breat  no CVA tenderness  Cardio: S1, S2 normal, no murmur/click/rub/gallop  GI: Soft, non-tender, slight distension, hypoactive bowel sounds, no organomegaly  Male Genitalia: Deferred  Recent reports of priapism  Extremities: Extremities atraumatic, no cyanosis or edema, Homans sign is negative, no bilateral LE tenderness  Pulses: 2+ and symmetric  Neurological normal, CN II - XII intact, no focal deficits  Psych: Appropriate affect,     Data Reviewed: Basic Metabolic Panel:  Lab 03/19/12 8119 03/18/12 2043  NA 140 142  K 3.3* 2.8*  CL 107 102  CO2 24 26  GLUCOSE 106* 96  BUN 5* 6  CREATININE 0.50 0.53  CALCIUM 8.1* 9.2  MG -- --  PHOS -- --   GFR Estimated Creatinine Clearance: 142.6 ml/min (by C-G formula based on Cr of 0.5). Liver Function Tests:  Lab 03/18/12 2043  AST 26  ALT 30  ALKPHOS 68  BILITOT 1.1  PROT 7.6  ALBUMIN 3.7   No results found for this basename: LIPASE:5,AMYLASE:5 in the last 168 hours No results found for this basename: AMMONIA:5 in the last 168 hours Coagulation profile No results found for this basename: INR:5,PROTIME:5 in the last 168 hours  CBC:  Lab 03/19/12 0225 03/18/12 2043  WBC 9.0 9.5  NEUTROABS -- 7.1  HGB 8.7* 8.6*  HCT 25.7* 25.1*  MCV 97.0 95.4  PLT 624* 769*   Cardiac Enzymes:  Lab 03/19/12 1658 03/19/12 0831 03/19/12 0225  CKTOTAL 29 26 30   CKMB 1.0 1.0 0.9  CKMBINDEX -- -- --  TROPONINI <0.30 <0.30 <0.30   BNP: No components found with this basename: POCBNP:5 CBG: No results found for this basename: GLUCAP:5 in the last 168 hours D-Dimer No results found for this basename: DDIMER:2 in the last 72 hours Hgb A1c No results found for this basename: HGBA1C:2 in the last 72 hours Lipid Profile No results found for this basename: CHOL:2,HDL:2,LDLCALC:2,TRIG:2,CHOLHDL:2,LDLDIRECT:2 in the last 72 hours Thyroid function studies No results found for  this basename: TSH,T4TOTAL,FREET3,T3FREE,THYROIDAB in the last 72 hours Anemia work up  [employer] 03/18/12 2043  VITAMINB12 --  FOLATE --  FERRITIN --  TIBC --  IRON --  RETICCTPCT 22.4*   Microbiology No results found for this or any previous visit (from the past 240 hour(s)).  Procedures and Diagnostic Studies: Dg Chest 2 View  03/18/2012  *RADIOLOGY REPORT*  Clinical Data: Chest pain for several days.  Sickle cell disease.  CHEST - 2 VIEW  Comparison: 03/04/2012  Findings: Mild cardiac enlargement.  Ill-defined lower lobe opacities peripherally were not present previously and could represent atelectasis, early infiltrate, or edema.  There is no effusion or pneumothorax.  Osseous structures unremarkable.  IMPRESSION: Bilateral ill-defined lower lobe opacities peripherally not present previously on 03/04/2012.  Early subsegmental atelectasis or infiltrates are possible. Cardiomegaly.  Original Report Authenticated By: Elsie Stain, M.D.   Dg Chest 2 View  03/04/2012  *RADIOLOGY REPORT*  Clinical Data: Baseline for admission, history sickle cell disease  CHEST - 2 VIEW  Comparison: Chest radiograph 12/28/2011  Findings: The heart silhouette is mildly enlarged.  No effusion, infiltrate, or pneumothorax.  Right PICC line with tip in distal SVC  IMPRESSION:  Borderline cardiomegaly.  No infiltrate or infarction.  Original Report Authenticated By: Genevive Bi, M.D.   Ir Fluoro Guide Cv Line Right  03/03/2012  *RADIOLOGY REPORT*  Clinical Data: Sickle cell crisis; central venous access is requested for fluids and medications.  PICC LINE PLACEMENT WITH ULTRASOUND AND FLUOROSCOPIC  GUIDANCE  Fluoroscopy Time: 0.9 minutes.  The right arm was prepped with chlorhexidine, draped in the usual sterile fashion using maximum barrier technique (cap and mask, sterile gown, sterile gloves, large sterile sheet, hand hygiene and cutaneous antisepsis) and infiltrated locally with 1% Lidocaine.  Ultrasound  demonstrated patency of the right brachial vein, and this was documented with an image.  Under real-time ultrasound guidance, this vein was accessed with a 21 gauge micropuncture needle and image documentation was performed.  The needle was exchanged over a guidewire for a peel-away sheath through which a 5 [place] double lumen PICC trimmed to 39 cm was advanced, positioned with its tip at the lower SVC/right atrial junction.  Fluoroscopy during the procedure and fluoro spot radiograph confirms appropriate catheter position.  The catheter was flushed, secured to the skin with Prolene sutures, and covered with a sterile dressing.  Complications:  none  IMPRESSION: Successful right arm PICC line placement with ultrasound and fluoroscopic guidance.  The catheter is ready for use.  Read by: Jeananne Rama, P.A.-C  Original Report Authenticated By: Reola Calkins, M.D.    Scheduled Meds:   . albuterol  2.5 mg Nebulization Q6H  . aspirin EC  81 mg Oral Daily  . cefTRIAXone (ROCEPHIN)  IV  1 g Intravenous Q24H  . folic acid  1 mg Oral  Daily  . hydroxyurea  1,000 mg Oral Daily  . moxifloxacin  400 mg Intravenous Q24H  . mulitivitamin with minerals  1 tablet Oral Daily  . potassium chloride  20 mEq Oral Daily  . potassium chloride  10 mEq Intravenous Q1 Hr x 5  . sodium chloride  3 mL Intravenous Q12H  . DISCONTD: sodium chloride   Intravenous STAT   Continuous Infusions:   . sodium chloride 0.45 % with kcl 120 mL/hr at 03/19/12 2223  . DISCONTD: 0.9 % NaCl with KCl 40 mEq / L 100 mL/hr at 03/19/12 1241      LOS: 1 day   Gwinda Passe, NP Pager [phone]  03/19/2012, 11:55 PM

## 2012-03-19 NOTE — Progress Notes (Signed)
Clinical Social Work Department BRIEF PSYCHOSOCIAL ASSESSMENT 03/19/2012  Patient:  Justin Sharp, Justin Sharp     Account Number:  [id number]     Admit date:  03/18/2012  Clinical Social Worker:  Eddie Candle  Date/Time:  03/19/2012 11:14 AM  Referred by:  Physician  Date Referred:  03/19/2012 Referred for  Other - See comment   Other Referral:   Patient insurance will only reimburse at an in network facility and  is not in network and the insurance will not reimburse treat here at the [facility].   Interview type:  Patient Other interview type:    PSYCHOSOCIAL DATA Living Status:  FAMILY Admitted from facility:   Level of care:   Primary support name:  parents Primary support relationship to patient:  PARENT Degree of support available:   good    CURRENT CONCERNS Current Concerns  Other - See comment   Other Concerns:   Patient insurance will not reimburse for treatment at [facility].  Patient states that he prefers to have his treatment here at [facility] with Dr. August Saucer, but    SOCIAL WORK ASSESSMENT / PLAN CSW discussed patient with [facility] Patient Resource Management and [facility].  Both identified that patient has complex history including leaving AMA on 03/18/12. Patient states that he prefers his care at [facility] but states that he is willing to return to [facility] if a bed is available.   Assessment/plan status:  Referral to [facility] Other assessment/ plan:   CSW is working with CM in order to appeal denial due to lack of bed availability at [facility].   Information/referral to community resources:   Patient was open to signing a release of information in order to release his records from [facility] to Dr. Diamantina [facility] office for continued care.    PATIENT'S/FAMILY'S RESPONSE TO PLAN OF CARE: Patient in agreement with transfering to [facility] if bed is available due to insurance issues.

## 2012-03-20 NOTE — Progress Notes (Signed)
Subjective:  Patient complains of right-sided chest pain grade 8/10. His pain is localized without associated nausea vomiting diaphoresis. Denies any cough fever chills. Denies history of exertional chest pain  Objective:  Vital Signs in the last 24 hours: Temp:  [98.3 F (36.8 C)-99.5 F (37.5 C)] 98.3 F (36.8 C) (06/01 0610) Pulse Rate:  [64-78] 64  (06/01 0610) Resp:  [17-18] 17  (06/01 0610) BP: (107-117)/(65-73) 107/65 mmHg (06/01 0610) SpO2:  [97 %-100 %] 99 % (06/01 0940)  Intake/Output from previous day: 05/31 0701 - 06/01 0700 In: 3333 [P.O.:480; I.V.:2603; IV Piggyback:250] Out: 3600 [Urine:3600] Intake/Output from this shift:    Physical Exam: Neck: no adenopathy, no carotid bruit, no JVD and supple, symmetrical, trachea midline Lungs: clear to auscultation bilaterally Heart: regular rate and rhythm, S1, S2 normal, no murmur, click, rub or gallop Abdomen: soft, non-tender; bowel sounds normal; no masses,  no organomegaly Extremities: extremities normal, atraumatic, no cyanosis or edema  Lab Results:  Basename 03/19/12 0225 03/18/12 2043  WBC 9.0 9.5  HGB 8.7* 8.6*  PLT 624* 769*    Basename 03/19/12 0225 03/18/12 2043  NA 140 142  K 3.3* 2.8*  CL 107 102  CO2 24 26  GLUCOSE 106* 96  BUN 5* 6  CREATININE 0.50 0.53    Basename 03/19/12 1658 03/19/12 0831  TROPONINI <0.30 <0.30   Hepatic Function Panel  Basename 03/18/12 2043  PROT 7.6  ALBUMIN 3.7  AST 26  ALT 30  ALKPHOS 68  BILITOT 1.1  BILIDIR --  IBILI --   No results found for this basename: CHOL in the last 72 hours No results found for this basename: PROTIME in the last 72 hours  Imaging: Imaging results have been reviewed and Dg Chest 2 View  03/18/2012  *RADIOLOGY REPORT*  Clinical Data: Chest pain for several days.  Sickle cell disease.  CHEST - 2 VIEW  Comparison: 03/04/2012  Findings: Mild cardiac enlargement.  Ill-defined lower lobe opacities peripherally were not present  previously and could represent atelectasis, early infiltrate, or edema.  There is no effusion or pneumothorax.  Osseous structures unremarkable.  IMPRESSION: Bilateral ill-defined lower lobe opacities peripherally not present previously on 03/04/2012.  Early subsegmental atelectasis or infiltrates are possible. Cardiomegaly.  Original Report Authenticated By: Elsie Stain, M.D.    Cardiac Studies:  Assessment/Plan:  Chest pain syndrome Sickle cell crisis Anemia Resolving hypokalemia Resolving URI  Plan Continue present management  LOS: 2 days    Sharren Schnurr N 03/20/2012, 10:04 AM

## 2012-03-21 MED ORDER — ALBUTEROL SULFATE HFA 108 (90 BASE) MCG/ACT IN AERS
2.0000 | INHALATION_SPRAY | Freq: Three times a day (TID) | RESPIRATORY_TRACT | Status: DC
  Administered 2012-03-21 (×2): 2 via RESPIRATORY_TRACT
  Filled 2012-03-21: qty 6.7

## 2012-03-21 MED ORDER — ALBUTEROL SULFATE (5 MG/ML) 0.5% IN NEBU
2.5000 mg | INHALATION_SOLUTION | RESPIRATORY_TRACT | Status: DC | PRN
  Administered 2012-03-21: 2.5 mg via RESPIRATORY_TRACT
  Filled 2012-03-21: qty 0.5

## 2012-03-21 MED ORDER — ALBUTEROL SULFATE HFA 108 (90 BASE) MCG/ACT IN AERS
2.0000 | INHALATION_SPRAY | RESPIRATORY_TRACT | Status: DC | PRN
  Filled 2012-03-21: qty 6.7

## 2012-03-21 MED ORDER — ALBUTEROL SULFATE (5 MG/ML) 0.5% IN NEBU
2.5000 mg | INHALATION_SOLUTION | Freq: Three times a day (TID) | RESPIRATORY_TRACT | Status: DC
  Administered 2012-03-22 – 2012-03-23 (×4): 2.5 mg via RESPIRATORY_TRACT
  Filled 2012-03-21 (×3): qty 0.5

## 2012-03-21 MED ORDER — MOXIFLOXACIN HCL 400 MG PO TABS
400.0000 mg | ORAL_TABLET | Freq: Every day | ORAL | Status: DC
  Administered 2012-03-21 – 2012-03-22 (×2): 400 mg via ORAL
  Filled 2012-03-21 (×3): qty 1

## 2012-03-21 NOTE — Progress Notes (Signed)
I notify Dr. Sharyn Lull at this time re:  [facility] phoned to tell us they have a ready room and an accepting physician (Dr. Diana Eves).  Dr. Sharyn Lull states he chooses to complete pt's. treatment here, and that his discharge from our facility may be within 1-2 days.  [facility] was subsequently notified of this also.

## 2012-03-21 NOTE — Progress Notes (Signed)
Subjective:  Complaints of chest and back pain grade 7/10 feels slightly better. No anginal chest pain  Objective:  Vital Signs in the last 24 hours: Temp:  [98.2 F (36.8 C)-98.3 F (36.8 C)] 98.2 F (36.8 C) (06/02 0639) Pulse Rate:  [68-85] 68  (06/02 0639) Resp:  [18-20] 20  (06/02 0639) BP: (105-112)/(55-69) 110/55 mmHg (06/02 0639) SpO2:  [96 %-100 %] 96 % (06/02 0808) FiO2 (%):  [21 %] 21 % (06/02 0808)  Intake/Output from previous day: 06/01 0701 - 06/02 0700 In: -  Out: 2310 [Urine:2310] Intake/Output from this shift: Total I/O In: -  Out: 820 [Urine:820]  Physical Exam: Neck: no adenopathy, no carotid bruit, no JVD and supple, symmetrical, trachea midline Lungs: clear to auscultation bilaterally Heart: regular rate and rhythm, S1, S2 normal, no murmur, click, rub or gallop Abdomen: soft, non-tender; bowel sounds normal; no masses,  no organomegaly Extremities: extremities normal, atraumatic, no cyanosis or edema  Lab Results:  Basename 03/19/12 0225 03/18/12 2043  WBC 9.0 9.5  HGB 8.7* 8.6*  PLT 624* 769*    Basename 03/19/12 0225 03/18/12 2043  NA 140 142  K 3.3* 2.8*  CL 107 102  CO2 24 26  GLUCOSE 106* 96  BUN 5* 6  CREATININE 0.50 0.53    Basename 03/19/12 1658 03/19/12 0831  TROPONINI <0.30 <0.30   Hepatic Function Panel  Basename 03/18/12 2043  PROT 7.6  ALBUMIN 3.7  AST 26  ALT 30  ALKPHOS 68  BILITOT 1.1  BILIDIR --  IBILI --   No results found for this basename: CHOL in the last 72 hours No results found for this basename: PROTIME in the last 72 hours  Imaging: Imaging results have been reviewed and No results found.  Cardiac Studies:  Assessment/Plan:  Chest pain syndrome  Sickle cell crisis  Anemia  Resolving hypokalemia  Status post URI  Plan Check labs in a.m. Home soon  LOS: 3 days    Abdul Beirne N 03/21/2012, 10:43 AM

## 2012-03-21 NOTE — Progress Notes (Signed)
PHARMACIST - PHYSICIAN COMMUNICATION DR:   August Saucer CONCERNING: Antibiotic IV to Oral Route Change Policy  RECOMMENDATION: This patient is receiving Avelox by the intravenous route.  Based on criteria approved by the Pharmacy and Therapeutics Committee, the antibiotic(s) is/are being converted to the equivalent oral dose form(s).   DESCRIPTION: These criteria include:  Patient being treated for a respiratory tract infection, urinary tract infection, or cellulitis  The patient is not neutropenic and does not exhibit a GI malabsorption state  The patient is eating (either orally or via tube) and/or has been taking other orally administered medications for a least 24 hours  The patient is improving clinically and has a Tmax < 100.5  If you have questions about this conversion, please contact the Pharmacy Department  [x]   [phone] )  [facility] PharmD, [place] Pager [phone] 03/21/2012 10:53 AM

## 2012-03-21 NOTE — Progress Notes (Signed)
Patient voiced concerns that albuterol mdi did not relieve his chest tightness and symptoms near as well as the nebulizer had. Order changed back to nebs per patient request and optimal symptom relief.

## 2012-03-22 LAB — CBC
Platelets: 659 10*3/uL — ABNORMAL HIGH (ref 150–400)
RBC: 2.65 MIL/uL — ABNORMAL LOW (ref 4.22–5.81)
WBC: 8.1 10*3/uL (ref 4.0–10.5)

## 2012-03-22 LAB — RETICULOCYTES
RBC.: 2.66 MIL/uL — ABNORMAL LOW (ref 4.22–5.81)
Retic Count, Absolute: 303.2 10*3/uL — ABNORMAL HIGH (ref 19.0–186.0)
Retic Ct Pct: 11.4 % — ABNORMAL HIGH (ref 0.4–3.1)

## 2012-03-22 LAB — HEPATIC FUNCTION PANEL
Alkaline Phosphatase: 60 U/L (ref 39–117)
Indirect Bilirubin: 0.6 mg/dL (ref 0.3–0.9)
Total Protein: 6.6 g/dL (ref 6.0–8.3)

## 2012-03-22 LAB — BASIC METABOLIC PANEL
Calcium: 8.6 mg/dL (ref 8.4–10.5)
GFR calc non Af Amer: >90 mL/min (ref >90)
Sodium: 139 mEq/L (ref 135–145)

## 2012-03-22 MED ORDER — ZOLPIDEM TARTRATE 10 MG PO TABS
10.0000 mg | ORAL_TABLET | Freq: Every evening | ORAL | Status: DC | PRN
  Administered 2012-03-22: 10 mg via ORAL
  Filled 2012-03-22: qty 1

## 2012-03-22 NOTE — Discharge Summary (Signed)
Discharge Summary  Justin Sharp MR#: [id number]  DOB:1986-02-11  Date of Admission: 03/18/2012 Date of Discharge: 03/22/2012  Patient's PCP: Fonnie Mu, MD, MD  Attending Physician:Alpha Mysliwiec  Consults:   Dr. August Saucer   Discharge Diagnoses: Principal Problem: Sickle cell anemia with pain Active Problems:  Hypokalemia resolved   Chest pain syndrome  Brief Admitting History  Justin Sharp is an 26 y.o. African American  male with history of sickle cell disease,, presents to [facility] long emergency room complaining of chest pain, back and leg pain. Left AMA on 03/18/12 from [facility] review d/c summary. Admited for  bilateral opacification in both bases seen on chest x-ray. This was new.  Also note  toxic granulation in his differential. Treatment coarse included IVF, pain management , RT, and abt's.     Discharge Medications Medication List  As of 03/22/2012  5:12 PM   ASK your doctor about these medications         folic acid 1 MG tablet   Commonly known as: FOLVITE   Take 1 mg by mouth daily.      HYDROmorphone 8 MG tablet   Commonly known as: DILAUDID   Take 8 mg by mouth every 4 (four) hours as needed.      hydroxyurea 500 MG capsule   Commonly known as: HYDREA   Take 1,000 mg by mouth daily. May take with food to minimize GI side effects.      mulitivitamin with minerals Tabs   Take 1 tablet by mouth daily.      oxycodone 5 MG capsule   Commonly known as: OXY-IR   Take 2 capsules (10 mg total) by mouth every 4 (four) hours as needed. For pain      potassium chloride 10 MEQ tablet   Commonly known as: K-DUR   Take 2 tablets (20 mEq total) by mouth daily.            Hospital Course: Sickle cell anemia with pain Present on Admission:  .Sickle cell anemia with pain .Hypokalemia .Chest pain syndrome    Day of Discharge BP 99/62  Pulse 89  Temp(Src) 98.4 F (36.9 C) (Oral)  Resp 18  Ht 6\' 1"  (1.854 m)  Wt 71.4 kg (157 lb 6.5 oz)  BMI 20.77  kg/m2  SpO2 96% General Appearance: Alert and oriented, well developed, well, nourished, no acute distress (multiple tattoo) Head: Normocephalic, atraumatic  Eyes: PERRLA, EOMI, scleral icterus  Nose: Nares, septum and mucosa are normal, no drainage or sinus tenderness  Throat: Lips, mucosa, and tongue normal, dentition good Neck: No adenopathy, supple, symmetrical, trachea midline  Resp: CTA bilaterally, diminished bases, /rales/rhonchi, shortness of breat  no CVA tenderness  Cardio: S1, S2 normal, no murmur/click/rub/gallop  GI: Soft, non-tender, slight distension, hypoactive bowel sounds, no organomegaly  Male Genitalia: Deferred  Recent reports of priapism  Extremities: Extremities atraumatic, no cyanosis or edema, Homans sign is negative, no bilateral LE tenderness  Pulses: 2+ and symmetric  Neurological normal, CN II - XII intact, no focal deficits  Psych: Appropriate affect,   Results for orders placed during the hospital encounter of 03/18/12 (from the past 48 hour(s))  BASIC METABOLIC PANEL     Status: Normal   Collection Time   03/22/12  5:05 AM      Component Value Range Comment   Sodium 139  135 - 145 (mEq/L)    Potassium 4.1  3.5 - 5.1 (mEq/L)    Chloride 103  96 - 112 (mEq/L)  CO2 26  19 - 32 (mEq/L)    Glucose, Bld 89  70 - 99 (mg/dL)    BUN 11  6 - 23 (mg/dL)    Creatinine, Ser 1.19  0.50 - 1.35 (mg/dL)    Calcium 8.6  8.4 - 10.5 (mg/dL)    GFR calc non Af Amer >90  >90 (mL/min)    GFR calc Af Amer >90  >90 (mL/min)   CBC     Status: Abnormal   Collection Time   03/22/12  5:05 AM      Component Value Range Comment   WBC 8.1  4.0 - 10.5 (K/uL)    RBC 2.65 (*) 4.22 - 5.81 (MIL/uL)    Hemoglobin 8.6 (*) 13.0 - 17.0 (g/dL)    HCT 14.7 (*) 82.9 - 52.0 (%)    MCV 96.2  78.0 - 100.0 (fL)    MCH 32.5  26.0 - 34.0 (pg)    MCHC 33.7  30.0 - 36.0 (g/dL)    RDW 56.2 (*) 13.0 - 15.5 (%)    Platelets 659 (*) 150 - 400 (K/uL)   RETICULOCYTES     Status: Abnormal    Collection Time   03/22/12  5:05 AM      Component Value Range Comment   Retic Ct Pct 11.4 (*) 0.4 - 3.1 (%)    RBC. 2.66 (*) 4.22 - 5.81 (MIL/uL)    Retic Count, Manual 303.2 (*) 19.0 - 186.0 (K/uL)   HEPATIC FUNCTION PANEL     Status: Abnormal   Collection Time   03/22/12  5:05 AM      Component Value Range Comment   Total Protein 6.6  6.0 - 8.3 (g/dL)    Albumin 3.2 (*) 3.5 - 5.2 (g/dL)    AST 17  0 - 37 (U/L)    ALT 15  0 - 53 (U/L)    Alkaline Phosphatase 60  39 - 117 (U/L)    Total Bilirubin 0.8  0.3 - 1.2 (mg/dL)    Bilirubin, Direct 0.2  0.0 - 0.3 (mg/dL)    Indirect Bilirubin 0.6  0.3 - 0.9 (mg/dL)     Dg Chest 2 View  8/65/7846  *RADIOLOGY REPORT*  Clinical Data: Chest pain for several days.  Sickle cell disease.  CHEST - 2 VIEW  Comparison: 03/04/2012  Findings: Mild cardiac enlargement.  Ill-defined lower lobe opacities peripherally were not present previously and could represent atelectasis, early infiltrate, or edema.  There is no effusion or pneumothorax.  Osseous structures unremarkable.  IMPRESSION: Bilateral ill-defined lower lobe opacities peripherally not present previously on 03/04/2012.  Early subsegmental atelectasis or infiltrates are possible. Cardiomegaly.  Original Report Authenticated By: Elsie Stain, M.D.   Dg Chest 2 View  03/04/2012  *RADIOLOGY REPORT*  Clinical Data: Baseline for admission, history sickle cell disease  CHEST - 2 VIEW  Comparison: Chest radiograph 12/28/2011  Findings: The heart silhouette is mildly enlarged.  No effusion, infiltrate, or pneumothorax.  Right PICC line with tip in distal SVC  IMPRESSION:  Borderline cardiomegaly.  No infiltrate or infarction.  Original Report Authenticated By: Genevive Bi, M.D.   Ir Fluoro Guide Cv Line Right  03/03/2012  *RADIOLOGY REPORT*  Clinical Data: Sickle cell crisis; central venous access is requested for fluids and medications.  PICC LINE PLACEMENT WITH ULTRASOUND AND FLUOROSCOPIC  GUIDANCE   Fluoroscopy Time: 0.9 minutes.  The right arm was prepped with chlorhexidine, draped in the usual sterile fashion using maximum barrier technique (cap and mask,  sterile gown, sterile gloves, large sterile sheet, hand hygiene and cutaneous antisepsis) and infiltrated locally with 1% Lidocaine.  Ultrasound demonstrated patency of the right brachial vein, and this was documented with an image.  Under real-time ultrasound guidance, this vein was accessed with a 21 gauge micropuncture needle and image documentation was performed.  The needle was exchanged over a guidewire for a peel-away sheath through which a 5 [place] double lumen PICC trimmed to 39 cm was advanced, positioned with its tip at the lower SVC/right atrial junction.  Fluoroscopy during the procedure and fluoro spot radiograph confirms appropriate catheter position.  The catheter was flushed, secured to the skin with Prolene sutures, and covered with a sterile dressing.  Complications:  none  IMPRESSION: Successful right arm PICC line placement with ultrasound and fluoroscopic guidance.  The catheter is ready for use.  Read by: Jeananne Rama, P.A.-C  Original Report Authenticated By: Reola Calkins, M.D.     Disposition: Transfer to [facility] hospital when bed available Dr. Sturgis Callas will be admitting physician.   Diet: Regular   Activity: up at lib    Follow-up Appts:  To be determine by accepting hospital     Time spent on discharge, talking to the patient, and coordinating care: 60 mins.   SignedGwinda Passe 03/22/2012, 5:12 PM

## 2012-03-22 NOTE — Progress Notes (Signed)
CSW has met with patient in order to discuss new orders for a sitter due to standards of care at previous facility.  CSW working with rest of medical team in order to facilitate transfer to [facility] for continued acute care.  CSW will continue to follow.  Beverly Sessions MSW, LCSW [phone]

## 2012-03-23 MED ORDER — MOXIFLOXACIN HCL 400 MG PO TABS: 400.0000 mg | ORAL_TABLET | Freq: Every day | ORAL | Status: AC

## 2012-03-23 MED ORDER — ALBUTEROL SULFATE (5 MG/ML) 0.5% IN NEBU: 2.5000 mg | INHALATION_SOLUTION | Freq: Two times a day (BID) | RESPIRATORY_TRACT | Status: DC

## 2012-03-23 MED ORDER — HYDROMORPHONE HCL 8 MG PO TABS: 8.0000 mg | ORAL_TABLET | ORAL | Status: DC | PRN

## 2012-03-23 NOTE — Discharge Summary (Signed)
Discharge Summary  Justin Sharp MR#: [id number]  DOB:[birth year]  Date of Admission: 03/18/2012 Date of Discharge: 03/23/2012  Patient's PCP: Fonnie Mu, MD, MD  Attending Physician:Vylet Maffia  Consults:   Dr. August Saucer   Discharge Diagnoses: Principal Problem: Sickle cell anemia with pain Active Problems:  Hypokalemia resolved   Chest pain syndrome  Brief Admitting History  [name] is an 26 y.o. African American  male with history of sickle cell disease,, presents to [facility] long emergency room complaining of chest pain, back and leg pain. Left AMA on 03/18/12 from [facility] review d/c summary. Admited for  bilateral opacification in both bases seen on chest x-ray. This was new.  Also note  toxic granulation in his differential. Treatment coarse included IVF, pain management , RT, and abt's.  Discharge note [name] is medically stable and will not be transferred to [facility] but discharge to home. Reviewing laboratory data and clinical assessment this patient is stable to go home and explained if he has any other problems or issues follow up with primary care  or go to [facility] ED for re-evaluation    Discharge Medications Medication List  As of 03/23/2012  1:03 PM   TAKE these medications         folic acid 1 MG tablet   Commonly known as: FOLVITE   Take 1 mg by mouth daily.      HYDROmorphone 8 MG tablet   Commonly known as: DILAUDID   Take 1 tablet (8 mg total) by mouth every 4 (four) hours as needed.      hydroxyurea 500 MG capsule   Commonly known as: HYDREA   Take 1,000 mg by mouth daily. May take with food to minimize GI side effects.      moxifloxacin 400 MG tablet   Commonly known as: AVELOX   Take 1 tablet (400 mg total) by mouth at bedtime.         ASK your doctor about these medications         mulitivitamin with minerals Tabs   Take 1 tablet by mouth daily.      oxycodone 5 MG capsule   Commonly known as: OXY-IR   Take 2 capsules (10 mg  total) by mouth every 4 (four) hours as needed. For pain      potassium chloride 10 MEQ tablet   Commonly known as: K-DUR   Take 2 tablets (20 mEq total) by mouth daily.            Hospital Course: Sickle cell anemia with pain Present on Admission:  .Sickle cell anemia with pain .Hypokalemia .Chest pain syndrome    Day of Discharge BP 101/52  Pulse 97  Temp(Src) 98.9 F (37.2 C) (Oral)  Resp 16  Ht 6\' 1"  (1.854 m)  Wt 71.4 kg (157 lb 6.5 oz)  BMI 20.77 kg/m2  SpO2 98% General Appearance: Alert and oriented, well developed, well, nourished, no acute distress (multiple tattoo) Head: Normocephalic, atraumatic  Eyes: PERRLA, EOMI, scleral icterus  Nose: Nares, septum and mucosa are normal, no drainage or sinus tenderness  Throat: Lips, mucosa, and tongue normal, dentition good Neck: No adenopathy, supple, symmetrical, trachea midline  Resp: CTA bilaterally, diminished bases, /rales/rhonchi, shortness of breat  no CVA tenderness  Cardio: S1, S2 normal, no murmur/click/rub/gallop  GI: Soft, non-tender, slight distension, hypoactive bowel sounds, no organomegaly  Male Genitalia: Deferred  Recent reports of priapism  Extremities: Extremities atraumatic, no cyanosis or edema, Homans sign is negative, no  bilateral LE tenderness  Pulses: 2+ and symmetric  Neurological normal, CN II - XII intact, no focal deficits  Psych: Appropriate affect,   Results for orders placed during the hospital encounter of 03/18/12 (from the past 48 hour(s))  BASIC METABOLIC PANEL     Status: Normal   Collection Time   03/22/12  5:05 AM      Component Value Range Comment   Sodium 139  135 - 145 (mEq/L)    Potassium 4.1  3.5 - 5.1 (mEq/L)    Chloride 103  96 - 112 (mEq/L)    CO2 26  19 - 32 (mEq/L)    Glucose, Bld 89  70 - 99 (mg/dL)    BUN 11  6 - 23 (mg/dL)    Creatinine, Ser 1.61  0.50 - 1.35 (mg/dL)    Calcium 8.6  8.4 - 10.5 (mg/dL)    GFR calc non Af Amer >90  >90 (mL/min)    GFR calc Af  Amer >90  >90 (mL/min)   CBC     Status: Abnormal   Collection Time   03/22/12  5:05 AM      Component Value Range Comment   WBC 8.1  4.0 - 10.5 (K/uL)    RBC 2.65 (*) 4.22 - 5.81 (MIL/uL)    Hemoglobin 8.6 (*) 13.0 - 17.0 (g/dL)    HCT 09.6 (*) 04.5 - 52.0 (%)    MCV 96.2  78.0 - 100.0 (fL)    MCH 32.5  26.0 - 34.0 (pg)    MCHC 33.7  30.0 - 36.0 (g/dL)    RDW 40.9 (*) 81.1 - 15.5 (%)    Platelets 659 (*) 150 - 400 (K/uL)   RETICULOCYTES     Status: Abnormal   Collection Time   03/22/12  5:05 AM      Component Value Range Comment   Retic Ct Pct 11.4 (*) 0.4 - 3.1 (%)    RBC. 2.66 (*) 4.22 - 5.81 (MIL/uL)    Retic Count, Manual 303.2 (*) 19.0 - 186.0 (K/uL)   HEPATIC FUNCTION PANEL     Status: Abnormal   Collection Time   03/22/12  5:05 AM      Component Value Range Comment   Total Protein 6.6  6.0 - 8.3 (g/dL)    Albumin 3.2 (*) 3.5 - 5.2 (g/dL)    AST 17  0 - 37 (U/L)    ALT 15  0 - 53 (U/L)    Alkaline Phosphatase 60  39 - 117 (U/L)    Total Bilirubin 0.8  0.3 - 1.2 (mg/dL)    Bilirubin, Direct 0.2  0.0 - 0.3 (mg/dL)    Indirect Bilirubin 0.6  0.3 - 0.9 (mg/dL)     Dg Chest 2 View  07/03/7828  *RADIOLOGY REPORT*  Clinical Data: Chest pain for several days.  Sickle cell disease.  CHEST - 2 VIEW  Comparison: 03/04/2012  Findings: Mild cardiac enlargement.  Ill-defined lower lobe opacities peripherally were not present previously and could represent atelectasis, early infiltrate, or edema.  There is no effusion or pneumothorax.  Osseous structures unremarkable.  IMPRESSION: Bilateral ill-defined lower lobe opacities peripherally not present previously on 03/04/2012.  Early subsegmental atelectasis or infiltrates are possible. Cardiomegaly.  Original Report Authenticated By: Elsie Stain, M.D.   Dg Chest 2 View  03/04/2012  *RADIOLOGY REPORT*  Clinical Data: Baseline for admission, history sickle cell disease  CHEST - 2 VIEW  Comparison: Chest radiograph 12/28/2011  Findings: The  heart  silhouette is mildly enlarged.  No effusion, infiltrate, or pneumothorax.  Right PICC line with tip in distal SVC  IMPRESSION:  Borderline cardiomegaly.  No infiltrate or infarction.  Original Report Authenticated By: Genevive Bi, M.D.   Ir Fluoro Guide Cv Line Right  03/03/2012  *RADIOLOGY REPORT*  Clinical Data: Sickle cell crisis; central venous access is requested for fluids and medications.  PICC LINE PLACEMENT WITH ULTRASOUND AND FLUOROSCOPIC  GUIDANCE  Fluoroscopy Time: 0.9 minutes.  The right arm was prepped with chlorhexidine, draped in the usual sterile fashion using maximum barrier technique (cap and mask, sterile gown, sterile gloves, large sterile sheet, hand hygiene and cutaneous antisepsis) and infiltrated locally with 1% Lidocaine.  Ultrasound demonstrated patency of the right brachial vein, and this was documented with an image.  Under real-time ultrasound guidance, this vein was accessed with a 21 gauge micropuncture needle and image documentation was performed.  The needle was exchanged over a guidewire for a peel-away sheath through which a 5 [place] double lumen PICC trimmed to 39 cm was advanced, positioned with its tip at the lower SVC/right atrial junction.  Fluoroscopy during the procedure and fluoro spot radiograph confirms appropriate catheter position.  The catheter was flushed, secured to the skin with Prolene sutures, and covered with a sterile dressing.  Complications:  none  IMPRESSION: Successful right arm PICC line placement with ultrasound and fluoroscopic guidance.  The catheter is ready for use.  Read by: Jeananne Rama, P.A.-C  Original Report Authenticated By: Reola Calkins, M.D.     Disposition: Transfer to [facility] hospital when bed available Dr. [place] Callas will be admitting physician.   Diet: Regular   Activity: up at lib    Follow-up Appts:   Time spent on discharge, talking to the patient, and coordinating care: 60 mins.   SignedRanda Evens,  Robinette Esters 03/23/2012, 1:03 PM

## 2012-05-01 NOTE — Discharge Summary (Signed)
[facility] Discharge Summary  Patient ID: Justin Sharp MRN: [id number] DOB/AGE: [birth year] 26 y.o.  Admit date: 01/15/2012 Discharge date: 01/15/2012  Admission Diagnoses:  Discharge Diagnoses:  Sickle cell anemia with pain Situational stress Chronic pain syndrome Substance abuse  Discharged Condition: improved   Clinic Course:  Patient was admitted to the [facility] He was started on IV fluids of half-normal saline at 125 cc an hour. Patient was given Dilaudid at 4 mg q.2 hours p.r.n. Pain. He was continued on IV Zofran and Benadryl as needed for nausea and pruritus respectively. He was continued on his home medications as well. Blood work was not consistent with active hemolysis. Issues of patient's chronic pain was reviewed as well. He was encouraged to have regular followup at one facility. Patient felt better at end of his treatment. He was subsequently discharged home much improved.   Significant Diagnostic Studies: as per admission H&P  Discharge Vital signs: Blood pressure 122/72, pulse 82, temperature 99.7 F (37.6 C), temperature source Oral, resp. rate 20, SpO2 99.00%.   Disposition: 01-Home or Self Care  Discharge Orders    Future Orders Please Complete By Expires   Diet - low sodium heart healthy      Increase activity slowly      No wound care      Call MD for:  severe uncontrolled pain        Discharge Medications: Hydroxyurea 1000 mg daily Multivitamin with mineral tabs one daily Potassium chloride 10 mEq 2 tablets daily Folic acid 1 mg daily Dilaudid 8 mg q.4 hours p.r.n. Oxycodone 5 mg q.4 to 6 hours p.r.n.   SignedAugust Saucer, Artice Holohan 01/15/2012, 08:30 PM

## 2012-05-02 NOTE — H&P (Signed)
Patient ID: Justin Sharp, male   DOB: 1986-04-19, 26 y.o.   MRN: [id number]  Chief Complaint  Patient presents with  . Sickle Cell Pain Crisis  . Chest Pain    HPI Justin Sharp is a 26 y.o. male.  This is the third [facility] visit for this 26 year old black male with sickle cell disease who is being followed at [facility]. Patient however states he wants to have his care here in [place]. Patient came to the emergency room complaining of chest pain treated it was no associated shortness or breath or diaphoresis. His pain is been typical for his sickle cell crisis previously. Patient had been taking his hydroxyurea and folic acid on a regular basis. His home pain medication of Dilaudid 8 mg could not control his pain. Patient was seen in emergency room for initial evaluation. It was determined that he did not have acute chest syndrome. Patient was transferred to the sickle cell unit for further treatment.   Past Medical History  Diagnosis Date  . Sickle cell anemia     Past Surgical History  Procedure Date  . Appendectomy     Family History  Problem Relation Age of Onset  . Diabetes Other   . Coronary artery disease Other     Social History History  Substance Use Topics  . Smoking status: Never Smoker   . Smokeless tobacco: Not on file  . Alcohol Use: No    Allergies  Allergen Reactions  . Sulfa Antibiotics Hives  . Sulfa Drugs Cross Reactors Hives    No current facility-administered medications for this encounter.   Current Outpatient Prescriptions  Medication Sig Dispense Refill  . HYDROmorphone (DILAUDID) 8 MG tablet Take 1 tablet (8 mg total) by mouth every 4 (four) hours as needed.  20 tablet  0  . hydroxyurea (HYDREA) 500 MG capsule Take 1,000 mg by mouth daily. May take with food to minimize GI side effects.       . Multiple Vitamin (MULITIVITAMIN WITH MINERALS) TABS Take 1 tablet by mouth daily.      Marland Kitchen oxycodone (OXY-IR) 5 MG capsule Take 2 capsules (10 mg total) by  mouth every 4 (four) hours as needed. For pain  60 capsule  0  . potassium chloride (K-DUR) 10 MEQ tablet Take 2 tablets (20 mEq total) by mouth daily.  30 tablet  0    Review of Systems As noted above. Questions raise from previous visits regarding substance abuse. Patient denies at present.  Blood pressure 113/71, pulse 81, temperature 98.3 F (36.8 C), temperature source Oral, resp. rate 18, SpO2 97.00%.  Physical Exam Well-developed well-nourished black male in no acute distress. Complaining of pain. HEENT: No sinus tenderness. No sclera icterus. TMs are clear. Posterior pharynx is clear. NECK: No enlarged thyroid. No posterior cervical nodes. LUNGS: Clear to auscultation. No vocal fremitus. CV: Normal S1, S2 without S3. ABDOMEN: Bowel sounds are present. Soft nontender. EXTREMITIES: no joint tenderness. Negative Homans. No edema. NEURO: Nonfocal.  Data Reviewed CMET: Sodium 138, potassium 3.3, chloride 106, CO2 26, creatinine 0.49, total bilirubin 0.8. CBC: WBC of 8600, hemoglobin 7.9, hematocrit 22.8  Assessment    Sickle cell crisis Hypokalemia Noncardiac chest pain Situational stress. Narcotic tolerance.    Plan    IV fluids for hydration. Replenish potassium. IV Dilaudid for acute pain control. Treat nausea and pruritus as necessary. Repeat electrolyte picture before discharge.       Justin Sharp, Justin Sharp 01/20/2012   9:37 AM

## 2012-05-02 NOTE — H&P (Deleted)
Patient ID: Justin Sharp, male   DOB: [birth year], 26 y.o.   MRN: [id number]  Chief Complaint  Patient presents with  . Sickle Cell Pain Crisis  . Chest Pain    HPI Justin Sharp is a 26 y.o. male.  This is the third [facility] visit for this 26 year old black male with sickle cell disease who is being followed at [facility]. Patient however states he wants to have his care here in [place]. Patient came to the emergency room complaining of chest pain treated it was no associated shortness or breath or diaphoresis. His pain is been typical for his sickle cell crisis previously. Patient had been taking his hydroxyurea and folic acid on a regular basis. His home pain medication of Dilaudid 8 mg could not control his pain. Patient was seen in emergency room for initial evaluation. It was determined that he did not have acute chest syndrome. Patient was transferred to the sickle cell unit for further treatment.   Past Medical History  Diagnosis Date  . Sickle cell anemia     Past Surgical History  Procedure Date  . Appendectomy     Family History  Problem Relation Age of Onset  . Diabetes Other   . Coronary artery disease Other     Social History History  Substance Use Topics  . Smoking status: Never Smoker   . Smokeless tobacco: Not on file  . Alcohol Use: No    Allergies  Allergen Reactions  . Sulfa Antibiotics Hives  . Sulfa Drugs Cross Reactors Hives    No current facility-administered medications for this encounter.   Current Outpatient Prescriptions  Medication Sig Dispense Refill  . HYDROmorphone (DILAUDID) 8 MG tablet Take 1 tablet (8 mg total) by mouth every 4 (four) hours as needed.  20 tablet  0  . hydroxyurea (HYDREA) 500 MG capsule Take 1,000 mg by mouth daily. May take with food to minimize GI side effects.       . Multiple Vitamin (MULITIVITAMIN WITH MINERALS) TABS Take 1 tablet by mouth daily.      Marland Kitchen oxycodone (OXY-IR) 5 MG capsule Take 2 capsules (10 mg total) by  mouth every 4 (four) hours as needed. For pain  60 capsule  0  . potassium chloride (K-DUR) 10 MEQ tablet Take 2 tablets (20 mEq total) by mouth daily.  30 tablet  0    Review of Systems As noted above. Questions raise from previous visits regarding substance abuse. Patient denies at present.  Blood pressure 113/71, pulse 81, temperature 98.3 F (36.8 C), temperature source Oral, resp. rate 18, SpO2 97.00%.  Physical Exam Well-developed well-nourished black male in no acute distress. Complaining of pain. HEENT: No sinus tenderness. No sclera icterus. TMs are clear. Posterior pharynx is clear. NECK: No enlarged thyroid. No posterior cervical nodes. LUNGS: Clear to auscultation. No vocal fremitus. CV: Normal S1, S2 without S3. ABDOMEN: Bowel sounds are present. Soft nontender. EXTREMITIES: no joint tenderness. Negative Homans. No edema. NEURO: Nonfocal.  Data Reviewed CMET: Sodium 138, potassium 3.3, chloride 106, CO2 26, creatinine 0.49, total bilirubin 0.8. CBC: WBC of 8600, hemoglobin 7.9, hematocrit 22.8  Assessment    Sickle cell crisis Hypokalemia Noncardiac chest pain Situational stress. Narcotic tolerance.    Plan    IV fluids for hydration. Replenish potassium. IV Dilaudid for acute pain control. Treat nausea and pruritus as necessary. Repeat electrolyte picture before discharge.       Justin Sharp 01/20/2012   9:00 AM

## 2012-05-11 ENCOUNTER — Emergency Department (HOSPITAL_COMMUNITY): Payer: PRIVATE HEALTH INSURANCE

## 2012-05-11 ENCOUNTER — Emergency Department (HOSPITAL_COMMUNITY)
Admission: EM | Admit: 2012-05-11 | Discharge: 2012-05-12 | Disposition: A | Discharge status: Other Acute Inpatient Hospital | Payer: PRIVATE HEALTH INSURANCE | Source: Home / Self Care | Attending: Emergency Medicine | Admitting: Emergency Medicine

## 2012-05-11 ENCOUNTER — Encounter (HOSPITAL_COMMUNITY): Payer: Self-pay | Admitting: *Deleted

## 2012-05-11 DIAGNOSIS — D5700 Hb-SS disease with crisis, unspecified: Secondary | ICD-10-CM | POA: Insufficient documentation

## 2012-05-11 DIAGNOSIS — R079 Chest pain, unspecified: Secondary | ICD-10-CM | POA: Insufficient documentation

## 2012-05-11 DIAGNOSIS — Z9089 Acquired absence of other organs: Secondary | ICD-10-CM | POA: Insufficient documentation

## 2012-05-11 LAB — BASIC METABOLIC PANEL
BUN: 6 mg/dL (ref 6–23)
Calcium: 9.6 mg/dL (ref 8.4–10.5)
Creatinine, Ser: 0.53 mg/dL (ref 0.50–1.35)
GFR calc Af Amer: >90 mL/min (ref >90)
GFR calc non Af Amer: >90 mL/min (ref >90)
Potassium: 3.2 mEq/L — ABNORMAL LOW (ref 3.5–5.1)

## 2012-05-11 LAB — CBC WITH DIFFERENTIAL/PLATELET
HCT: 25.5 % — ABNORMAL LOW (ref 39.0–52.0)
Hemoglobin: 9.1 g/dL — ABNORMAL LOW (ref 13.0–17.0)
Lymphocytes Relative: 10 % — ABNORMAL LOW (ref 12–46)
Monocytes Absolute: 0.5 10*3/uL (ref 0.1–1.0)
Monocytes Relative: 7 % (ref 3–12)
RDW: 24 % — ABNORMAL HIGH (ref 11.5–15.5)
WBC: 7.1 10*3/uL (ref 4.0–10.5)

## 2012-05-11 LAB — RETICULOCYTES
RBC.: 2.66 MIL/uL — ABNORMAL LOW (ref 4.22–5.81)
Retic Ct Pct: 17.5 % — ABNORMAL HIGH (ref 0.4–3.1)

## 2012-05-11 MED ORDER — ONDANSETRON HCL 4 MG/2ML IJ SOLN
4.0000 mg | Freq: Once | INTRAMUSCULAR | Status: AC
  Administered 2012-05-11: 4 mg via INTRAVENOUS
  Filled 2012-05-11: qty 2

## 2012-05-11 MED ORDER — HYDROMORPHONE HCL PF 2 MG/ML IJ SOLN
3.0000 mg | Freq: Once | INTRAMUSCULAR | Status: AC
  Administered 2012-05-11: 3 mg via INTRAVENOUS
  Filled 2012-05-11: qty 2

## 2012-05-11 MED ORDER — DIPHENHYDRAMINE HCL 50 MG/ML IJ SOLN
25.0000 mg | Freq: Once | INTRAMUSCULAR | Status: AC
  Administered 2012-05-11: 25 mg via INTRAVENOUS
  Filled 2012-05-11: qty 1

## 2012-05-11 MED ORDER — SODIUM CHLORIDE 0.9 % IV BOLUS (SEPSIS)
1000.0000 mL | Freq: Once | INTRAVENOUS | Status: AC
  Administered 2012-05-11: 1000 mL via INTRAVENOUS

## 2012-05-11 MED ORDER — POTASSIUM CHLORIDE CRYS ER 20 MEQ PO TBCR
40.0000 meq | EXTENDED_RELEASE_TABLET | Freq: Once | ORAL | Status: AC
  Administered 2012-05-11: 40 meq via ORAL
  Filled 2012-05-11: qty 2

## 2012-05-11 NOTE — Progress Notes (Signed)
CSW received a call from an RN at [facility] stating that the pt could not be admitted to the clinic due to concerns with insurance. RN suggested having the pt be followed at [facility]   CSW reviewed previous notes by [facility] in [facility] regarding the pt's insurance concerns. CSW notified EDP of call from clinic and noted issues with pt's insurance.

## 2012-05-11 NOTE — ED Notes (Signed)
Pt c/o sickle cell pain since Saturday; main pain in chest

## 2012-05-11 NOTE — ED Provider Notes (Signed)
History     CSN: [id number]  Arrival date & time 05/11/12  2026   First MD Initiated Contact with Patient 05/11/12 2044      Chief Complaint  Patient presents with  . Sickle Cell Pain Crisis   HPI  History provided by the patient. Patient is a 26 year old male with history of sickle cell anemia presents with complaints of similar sickle cell pain. Pain is primary located right chest area. Pain has been present for the past 4 days. Patient has been using oxycodone and by mouth Dilaudid at home without improvement. Patient is followed by Dr. August Saucer and call sickle cell clinic was advised that were full and to come to the emergency room. Patient denies any other changes in symptoms from prior sickle cell pains. Denies any recent fever, chills, sweats, cough or shortness of breath.    Past Medical History  Diagnosis Date  . Sickle cell anemia     Past Surgical History  Procedure Date  . Appendectomy     Family History  Problem Relation Age of Onset  . Diabetes Other   . Coronary artery disease Other     History  Substance Use Topics  . Smoking status: Never Smoker   . Smokeless tobacco: Not on file  . Alcohol Use: No      Review of Systems  Constitutional: Negative for fever and chills.  Respiratory: Negative for cough and shortness of breath.   Cardiovascular: Positive for chest pain.  Gastrointestinal: Negative for nausea and vomiting.  Musculoskeletal: Negative for myalgias.    Allergies  Sulfa antibiotics and Sulfa drugs cross reactors  Home Medications   Current Outpatient Rx  Name Route Sig Dispense Refill  . HYDROMORPHONE HCL 8 MG PO TABS Oral Take 1 tablet (8 mg total) by mouth every 4 (four) hours as needed. 20 tablet 0  . HYDROXYUREA 500 MG PO CAPS Oral Take 1,000 mg by mouth daily. May take with food to minimize GI side effects.     . ADULT MULTIVITAMIN W/MINERALS CH Oral Take 1 tablet by mouth daily.    . OXYCODONE HCL 5 MG PO CAPS Oral Take 2  capsules (10 mg total) by mouth every 4 (four) hours as needed. For pain 60 capsule 0  . POTASSIUM CHLORIDE ER 10 MEQ PO TBCR Oral Take 2 tablets (20 mEq total) by mouth daily. 30 tablet 0    BP 128/74  Pulse 112  Temp 98.8 F (37.1 C) (Oral)  Resp 22  SpO2 100%  Physical Exam  Nursing note and vitals reviewed. Constitutional: He is oriented to person, place, and time. He appears well-developed and well-nourished. No distress.  HENT:  Head: Normocephalic.  Cardiovascular: Normal rate and regular rhythm.   Pulmonary/Chest: Effort normal and breath sounds normal. No respiratory distress. He has no wheezes. He has no rales.  Abdominal: Soft. There is no tenderness.  Neurological: He is alert and oriented to person, place, and time.  Skin: Skin is warm.  Psychiatric: He has a normal mood and affect. His behavior is normal.    ED Course  Procedures  Results for orders placed during the hospital encounter of 05/11/12  CBC WITH DIFFERENTIAL      Component Value Range   WBC 7.1  4.0 - 10.5 K/uL   RBC 2.66 (*) 4.22 - 5.81 MIL/uL   Hemoglobin 9.1 (*) 13.0 - 17.0 g/dL   HCT 78.2 (*) 95.6 - 21.3 %   MCV 95.9  78.0 - 100.0 fL  MCH 34.2 (*) 26.0 - 34.0 pg   MCHC 35.7  30.0 - 36.0 g/dL   RDW 16.1 (*) 09.6 - 04.5 %   Platelets 674 (*) 150 - 400 K/uL   Neutrophils Relative 83 (*) 43 - 77 %   Lymphocytes Relative 10 (*) 12 - 46 %   Monocytes Relative 7  3 - 12 %   Eosinophils Relative 0  0 - 5 %   Basophils Relative 0  0 - 1 %   RBC Morphology RARE NRBCs     WBC Morphology VACUOLATED NEUTROPHILS     Neutro Abs 5.9  1.7 - 7.7 K/uL   Lymphs Abs 0.7  0.7 - 4.0 K/uL   Monocytes Absolute 0.5  0.1 - 1.0 K/uL   Eosinophils Absolute 0.0  0.0 - 0.7 K/uL   Basophils Absolute 0.0  0.0 - 0.1 K/uL  RETICULOCYTES      Component Value Range   Retic Ct Pct 17.5 (*) 0.4 - 3.1 %   RBC. 2.66 (*) 4.22 - 5.81 MIL/uL   Retic Count, Manual 465.5 (*) 19.0 - 186.0 K/uL  BASIC METABOLIC PANEL       Component Value Range   Sodium 140  135 - 145 mEq/L   Potassium 3.2 (*) 3.5 - 5.1 mEq/L   Chloride 104  96 - 112 mEq/L   CO2 25  19 - 32 mEq/L   Glucose, Bld 114 (*) 70 - 99 mg/dL   BUN 6  6 - 23 mg/dL   Creatinine, Ser 4.09  0.50 - 1.35 mg/dL   Calcium 9.6  8.4 - 81.1 mg/dL   GFR calc non Af Amer >90  >90 mL/min   GFR calc Af Amer >90  >90 mL/min       Dg Chest 2 View  05/11/2012  *RADIOLOGY REPORT*  Clinical Data: Sickle cell crisis, chest pain  CHEST - 2 VIEW  Comparison: 03/18/2012  Findings: Prominent heart size but normal vascularity.  Negative for focal airspace process, edema, CHF, collapse, consolidation, effusion or pneumothorax.  Trachea midline.  No acute osseous finding.  IMPRESSION: No acute chest process.  Original Report Authenticated By: Judie Petit. Ruel Favors, M.D.     1. Sickle cell anemia with pain       MDM  Patient seen and evaluated. Patient with slight tachycardia appears uncomfortable. Patient states pain is typical sickle cell pain primarily located right chest this time. Denies recent fever, cough, shortness of breath.   Spoke with [facility] hospitalist. They will see patient and admit to the sickle cell clinic. Patient will be discharged to the clinic.     Date: 05/11/2012  Rate: 112  Rhythm: sinus tachycardia  QRS Axis: normal  Intervals: normal  ST/T Wave abnormalities: nonspecific ST/T changes  Conduction Disutrbances:none  Narrative Interpretation: Normal P waves seen. No a-flutter  Old EKG Reviewed: No significant changes from 03/18/2012. Tachycardia new.    Angus Seller, [place] 05/12/12 [phone]

## 2012-05-12 ENCOUNTER — Emergency Department (HOSPITAL_COMMUNITY): Payer: PRIVATE HEALTH INSURANCE

## 2012-05-12 ENCOUNTER — Non-Acute Institutional Stay (HOSPITAL_COMMUNITY)
Admission: AD | Admit: 2012-05-12 | Discharge: 2012-05-12 | Disposition: A | Discharge status: Home / Self Care | Payer: PRIVATE HEALTH INSURANCE | Source: Home / Self Care | Attending: Internal Medicine | Admitting: Internal Medicine

## 2012-05-12 ENCOUNTER — Telehealth (HOSPITAL_COMMUNITY): Payer: Self-pay

## 2012-05-12 ENCOUNTER — Encounter (HOSPITAL_COMMUNITY): Payer: Self-pay | Admitting: Hematology

## 2012-05-12 ENCOUNTER — Encounter (HOSPITAL_COMMUNITY): Payer: Self-pay | Admitting: Nurse Practitioner

## 2012-05-12 ENCOUNTER — Observation Stay (HOSPITAL_COMMUNITY)
Admission: EM | Admit: 2012-05-12 | Discharge: 2012-05-13 | DRG: 812 | Disposition: A | Discharge status: Home / Self Care | Payer: PRIVATE HEALTH INSURANCE | Attending: Family Medicine | Admitting: Family Medicine

## 2012-05-12 ENCOUNTER — Other Ambulatory Visit: Payer: Self-pay

## 2012-05-12 DIAGNOSIS — E876 Hypokalemia: Secondary | ICD-10-CM | POA: Diagnosis present

## 2012-05-12 DIAGNOSIS — D5700 Hb-SS disease with crisis, unspecified: Secondary | ICD-10-CM | POA: Insufficient documentation

## 2012-05-12 DIAGNOSIS — Z8673 Personal history of transient ischemic attack (TIA), and cerebral infarction without residual deficits: Secondary | ICD-10-CM | POA: Insufficient documentation

## 2012-05-12 DIAGNOSIS — F439 Reaction to severe stress, unspecified: Secondary | ICD-10-CM

## 2012-05-12 DIAGNOSIS — F430 Acute stress reaction: Secondary | ICD-10-CM | POA: Insufficient documentation

## 2012-05-12 HISTORY — DX: Cerebral infarction, unspecified: I63.9

## 2012-05-12 LAB — BASIC METABOLIC PANEL
BUN: 6 mg/dL (ref 6–23)
CO2: 24 mEq/L (ref 19–32)
CO2: 23 mEq/L (ref 19–32)
Calcium: 8.6 mg/dL (ref 8.4–10.5)
Calcium: 9 mg/dL (ref 8.4–10.5)
Chloride: 106 mEq/L (ref 96–112)
Creatinine, Ser: 0.49 mg/dL — ABNORMAL LOW (ref 0.50–1.35)
GFR calc Af Amer: >90 mL/min (ref >90)
GFR calc Af Amer: >90 mL/min (ref >90)
GFR calc non Af Amer: >90 mL/min (ref >90)
GFR calc non Af Amer: >90 mL/min (ref >90)
Glucose, Bld: 99 mg/dL (ref 70–99)
Potassium: 3.5 mEq/L (ref 3.5–5.1)
Sodium: 140 mEq/L (ref 135–145)
Sodium: 140 mEq/L (ref 135–145)

## 2012-05-12 LAB — CBC
HCT: 21.3 % — ABNORMAL LOW (ref 39.0–52.0)
Hemoglobin: 7.4 g/dL — ABNORMAL LOW (ref 13.0–17.0)
MCH: 33.3 pg (ref 26.0–34.0)
MCH: 33.6 pg (ref 26.0–34.0)
MCHC: 34.7 g/dL (ref 30.0–36.0)
MCHC: 35 g/dL (ref 30.0–36.0)
MCV: 95.9 fL (ref 78.0–100.0)
Platelets: 556 10*3/uL — ABNORMAL HIGH (ref 150–400)
Platelets: 566 10*3/uL — ABNORMAL HIGH (ref 150–400)
RBC: 2.22 MIL/uL — ABNORMAL LOW (ref 4.22–5.81)
RBC: 2.44 MIL/uL — ABNORMAL LOW (ref 4.22–5.81)
RDW: 23.5 % — ABNORMAL HIGH (ref 11.5–15.5)
WBC: 8.5 10*3/uL (ref 4.0–10.5)

## 2012-05-12 LAB — COMPREHENSIVE METABOLIC PANEL
Albumin: 3.8 g/dL (ref 3.5–5.2)
BUN: 5 mg/dL — ABNORMAL LOW (ref 6–23)
Chloride: 104 mEq/L (ref 96–112)
Creatinine, Ser: 0.57 mg/dL (ref 0.50–1.35)
Total Bilirubin: 1.2 mg/dL (ref 0.3–1.2)
Total Protein: 7.4 g/dL (ref 6.0–8.3)

## 2012-05-12 LAB — RETICULOCYTES
RBC.: 2.43 MIL/uL — ABNORMAL LOW (ref 4.22–5.81)
Retic Ct Pct: 19.3 % — ABNORMAL HIGH (ref 0.4–3.1)

## 2012-05-12 LAB — POCT I-STAT TROPONIN I: Troponin i, poc: 0 ng/mL (ref 0.00–0.08)

## 2012-05-12 MED ORDER — POTASSIUM CHLORIDE 20 MEQ/15ML (10%) PO LIQD: 40.0000 meq | Freq: Every day | ORAL | Status: DC

## 2012-05-12 MED ORDER — HYDROCODONE-ACETAMINOPHEN 5-325 MG PO TABS
1.0000 | ORAL_TABLET | ORAL | Status: DC | PRN
  Administered 2012-05-12: 2 via ORAL
  Filled 2012-05-12: qty 2

## 2012-05-12 MED ORDER — HYDROMORPHONE HCL PF 2 MG/ML IJ SOLN
2.0000 mg | Freq: Once | INTRAMUSCULAR | Status: AC
  Administered 2012-05-12: 2 mg via INTRAVENOUS
  Filled 2012-05-12: qty 1

## 2012-05-12 MED ORDER — KETOROLAC TROMETHAMINE 30 MG/ML IJ SOLN
30.0000 mg | Freq: Four times a day (QID) | INTRAMUSCULAR | Status: DC
  Administered 2012-05-13 (×2): 30 mg via INTRAVENOUS
  Filled 2012-05-12 (×6): qty 1

## 2012-05-12 MED ORDER — HYDROMORPHONE HCL 2 MG PO TABS
8.0000 mg | ORAL_TABLET | ORAL | Status: DC | PRN
  Administered 2012-05-13 (×3): 8 mg via ORAL
  Filled 2012-05-12 (×3): qty 4

## 2012-05-12 MED ORDER — HYDROMORPHONE HCL PF 2 MG/ML IJ SOLN
2.0000 mg | Freq: Once | INTRAMUSCULAR | Status: DC
  Filled 2012-05-12: qty 1

## 2012-05-12 MED ORDER — ONDANSETRON HCL 4 MG/2ML IJ SOLN: 4.0000 mg | INTRAMUSCULAR | Status: DC | PRN

## 2012-05-12 MED ORDER — POTASSIUM CHLORIDE ER 10 MEQ PO TBCR
20.0000 meq | EXTENDED_RELEASE_TABLET | Freq: Every day | ORAL | Status: DC
  Administered 2012-05-13: 20 meq via ORAL
  Filled 2012-05-12: qty 2

## 2012-05-12 MED ORDER — DIPHENHYDRAMINE HCL 50 MG/ML IJ SOLN
25.0000 mg | Freq: Once | INTRAMUSCULAR | Status: AC
  Administered 2012-05-12: 25 mg via INTRAVENOUS
  Filled 2012-05-12: qty 1

## 2012-05-12 MED ORDER — ONDANSETRON HCL 4 MG/2ML IJ SOLN
4.0000 mg | Freq: Once | INTRAMUSCULAR | Status: AC
  Administered 2012-05-12: 4 mg via INTRAVENOUS
  Filled 2012-05-12: qty 2

## 2012-05-12 MED ORDER — HEPARIN SODIUM (PORCINE) 5000 UNIT/ML IJ SOLN
5000.0000 [IU] | Freq: Three times a day (TID) | INTRAMUSCULAR | Status: DC
  Administered 2012-05-13: 5000 [IU] via SUBCUTANEOUS
  Filled 2012-05-12 (×4): qty 1

## 2012-05-12 MED ORDER — ALUM & MAG HYDROXIDE-SIMETH 200-200-20 MG/5ML PO SUSP: 15.0000 mL | ORAL | Status: DC | PRN

## 2012-05-12 MED ORDER — HYDROMORPHONE HCL PF 2 MG/ML IJ SOLN
2.0000 mg | Freq: Once | INTRAMUSCULAR | Status: AC
  Administered 2012-05-12: 2 mg via INTRAMUSCULAR
  Filled 2012-05-12: qty 1

## 2012-05-12 MED ORDER — HYDROMORPHONE HCL PF 1 MG/ML IJ SOLN
1.0000 mg | INTRAMUSCULAR | Status: DC | PRN
  Administered 2012-05-12 – 2012-05-13 (×2): 1 mg via INTRAVENOUS
  Filled 2012-05-12 (×2): qty 1

## 2012-05-12 MED ORDER — ONDANSETRON HCL 4 MG PO TABS: 4.0000 mg | ORAL_TABLET | ORAL | Status: DC | PRN

## 2012-05-12 MED ORDER — ACETAMINOPHEN 650 MG RE SUPP: 650.0000 mg | RECTAL | Status: DC | PRN

## 2012-05-12 MED ORDER — HYDROMORPHONE HCL PF 2 MG/ML IJ SOLN
2.0000 mg | INTRAMUSCULAR | Status: DC | PRN
  Administered 2012-05-12 (×2): 2 mg via INTRAVENOUS
  Filled 2012-05-12 (×2): qty 1

## 2012-05-12 MED ORDER — DIPHENHYDRAMINE HCL 50 MG/ML IJ SOLN
12.5000 mg | INTRAMUSCULAR | Status: DC | PRN
  Administered 2012-05-12 (×2): 25 mg via INTRAVENOUS
  Filled 2012-05-12 (×2): qty 1

## 2012-05-12 MED ORDER — FOLIC ACID 1 MG PO TABS
1.0000 mg | ORAL_TABLET | Freq: Every day | ORAL | Status: DC
  Administered 2012-05-13: 1 mg via ORAL
  Filled 2012-05-12: qty 1

## 2012-05-12 MED ORDER — FOLIC ACID 1 MG PO TABS
1.0000 mg | ORAL_TABLET | Freq: Every day | ORAL | Status: DC
  Administered 2012-05-12: 1 mg via ORAL
  Filled 2012-05-12: qty 1

## 2012-05-12 MED ORDER — DOCUSATE SODIUM 100 MG PO CAPS
100.0000 mg | ORAL_CAPSULE | Freq: Two times a day (BID) | ORAL | Status: DC
  Administered 2012-05-13: 100 mg via ORAL
  Filled 2012-05-12 (×2): qty 1

## 2012-05-12 MED ORDER — OXYCODONE HCL 5 MG PO CAPS: 10.0000 mg | ORAL_CAPSULE | ORAL | Status: DC | PRN

## 2012-05-12 MED ORDER — ACETAMINOPHEN 325 MG PO TABS: 650.0000 mg | ORAL_TABLET | ORAL | Status: DC | PRN

## 2012-05-12 MED ORDER — ONDANSETRON HCL 4 MG/2ML IJ SOLN
4.0000 mg | INTRAMUSCULAR | Status: DC | PRN
  Administered 2012-05-12: 4 mg via INTRAVENOUS
  Filled 2012-05-12: qty 2

## 2012-05-12 MED ORDER — FOLIC ACID 1 MG PO TABS: 1.0000 mg | ORAL_TABLET | Freq: Every day | ORAL | Status: DC

## 2012-05-12 MED ORDER — SODIUM CHLORIDE 0.9 % IV SOLN
INTRAVENOUS | Status: DC
  Administered 2012-05-13: via INTRAVENOUS

## 2012-05-12 MED ORDER — HYDROMORPHONE HCL 2 MG PO TABS
2.0000 mg | ORAL_TABLET | ORAL | Status: DC | PRN
  Administered 2012-05-12: 4 mg via ORAL
  Filled 2012-05-12: qty 2

## 2012-05-12 MED ORDER — DEXTROSE-NACL 5-0.45 % IV SOLN
INTRAVENOUS | Status: DC
  Administered 2012-05-12: 02:00:00 via INTRAVENOUS

## 2012-05-12 MED ORDER — SODIUM CHLORIDE 0.9 % IV BOLUS (SEPSIS)
1000.0000 mL | Freq: Once | INTRAVENOUS | Status: AC
  Administered 2012-05-12: 1000 mL via INTRAVENOUS

## 2012-05-12 MED ORDER — DIPHENHYDRAMINE HCL 25 MG PO CAPS: 25.0000 mg | ORAL_CAPSULE | ORAL | Status: DC | PRN

## 2012-05-12 MED ORDER — HYDROMORPHONE HCL PF 2 MG/ML IJ SOLN
3.0000 mg | Freq: Once | INTRAMUSCULAR | Status: AC
  Administered 2012-05-12: 3 mg via INTRAVENOUS
  Filled 2012-05-12: qty 2

## 2012-05-12 MED ORDER — HYDROXYUREA 500 MG PO CAPS
500.0000 mg | ORAL_CAPSULE | Freq: Two times a day (BID) | ORAL | Status: DC
  Administered 2012-05-12: 500 mg via ORAL
  Filled 2012-05-12: qty 1

## 2012-05-12 MED ORDER — HYDROXYUREA 500 MG PO CAPS
1000.0000 mg | ORAL_CAPSULE | Freq: Every day | ORAL | Status: DC
  Administered 2012-05-13: 1000 mg via ORAL
  Filled 2012-05-12: qty 2

## 2012-05-12 NOTE — Progress Notes (Signed)
Pt admitted to unit 6700, room 6732. Report received from Rea. Pt alert and oriented. No skin issues. Pt c/o right shoulder pain. Diluadid 1mg  given. Will continue to monitor.

## 2012-05-12 NOTE — Social Work (Signed)
CSW met with patient to discuss challenges with insurance and treatment plan while in the [facility].  Patient stated that his insurance was still active until the end of the month (which this CSW confirmed) however, due to challenges in the past with this hospital being out of network, patient was encouraged to present at [facility] for further care.  NP joined meeting with patient to identify plan of treatment with only PO medications based on previous treatment at [facility] for the purpose of consistent care.  Patient disagreed with plan of care and requested IV pain meds for breakthrough.  NP identified that due to patient's labs, we would treat with PO meds and PO fluids until d/c.  Patient stated that he would prefer to take his PO meds at home. CSW reiterated that patient's medical team at [facility] encouraged him to report to their ED if pain persists.    CSW will continue to follow as needed.  Beverly Sessions MSW, LCSW [phone]

## 2012-05-12 NOTE — Progress Notes (Signed)
Patient ID: Justin Sharp, male   DOB: 02-13-1986, 26 y.o.   MRN: [id number] IV removed previously per order, belongings packed, patient refused pain medication prior to discharge

## 2012-05-12 NOTE — ED Notes (Signed)
Pt c/o chest pain "related to my sickle cell" since Saturday. Had an appt to go to sickle cell crisis clinic at [facility] today btu insurance would no cover it.

## 2012-05-12 NOTE — ED Provider Notes (Signed)
Medical screening examination/treatment/procedure(s) were performed by non-physician practitioner and as supervising physician I was immediately available for consultation/collaboration.  Toy Baker, MD 05/12/12 [phone]

## 2012-05-12 NOTE — ED Provider Notes (Signed)
History     CSN: [id number]  Arrival date & time 05/12/12  1212   First MD Initiated Contact with Patient 05/12/12 1506      Chief Complaint  Patient presents with  . Sickle Cell Pain Crisis    (Consider location/radiation/quality/duration/timing/severity/associated sxs/prior treatment) Patient is a 26 y.o. male presenting with sickle cell pain. The history is provided by the patient.  Sickle Cell Pain Crisis  This is a recurrent problem. The current episode started 3 to 5 days ago. The onset was gradual. The problem occurs frequently. The problem has been unchanged. The pain is associated with an unknown factor. Pain location: right chest and right shuolder. Site of pain is localized in a joint. The pain is similar to prior episodes. The pain is moderate. Nothing relieves the symptoms. The symptoms are not relieved by one or more prescription drugs. The symptoms are aggravated by activity and deep breaths. Associated symptoms include chest pain. Pertinent negatives include no abdominal pain, no nausea, no vomiting, no dysuria, no hematuria, no congestion, no headaches, no rhinorrhea, no sore throat, no back pain, no neck pain, no weakness, no cough and no rash. There is no swelling present. He has been behaving normally. He has been eating and drinking normally. The last void occurred less than 6 hours ago. His past medical history is significant for chronic pain. He sickle cell type is SS. There is a history of acute chest syndrome. There have been frequent pain crises. There is a history of stroke. He has not been treated with chronic transfusion therapy. He has not been treated with hydroxyurea. There were no sick contacts. Recently, medical care has been given at this facility. Services received include tests performed.    Past Medical History  Diagnosis Date  . Sickle cell anemia   . Stroke     states cva in 2009    Past Surgical History  Procedure Date  . Appendectomy      Family History  Problem Relation Age of Onset  . Diabetes Other   . Coronary artery disease Other     History  Substance Use Topics  . Smoking status: Never Smoker   . Smokeless tobacco: Not on file  . Alcohol Use: No      Review of Systems  Constitutional: Negative for fever, activity change, appetite change and fatigue.  HENT: Negative for congestion, sore throat, facial swelling, rhinorrhea, trouble swallowing, neck pain, neck stiffness, voice change and sinus pressure.   Eyes: Negative.   Respiratory: Negative for cough, choking, chest tightness, shortness of breath and wheezing.   Cardiovascular: Positive for chest pain.  Gastrointestinal: Negative for nausea, vomiting and abdominal pain.  Genitourinary: Negative for dysuria, urgency, frequency, hematuria, flank pain and difficulty urinating.  Musculoskeletal: Negative for back pain and gait problem.  Skin: Negative for rash and wound.  Neurological: Negative for facial asymmetry, weakness, numbness and headaches.  Psychiatric/Behavioral: Negative for behavioral problems, confusion and agitation. The patient is not nervous/anxious and is not hyperactive.   All other systems reviewed and are negative.    Allergies  Sulfa antibiotics and Sulfa drugs cross reactors  Home Medications   Current Outpatient Rx  Name Route Sig Dispense Refill  . FOLIC ACID 1 MG PO TABS Oral Take 1 mg by mouth daily.    Marland Kitchen HYDROMORPHONE HCL 8 MG PO TABS Oral Take 8 mg by mouth every 4 (four) hours as needed. For pain    . HYDROXYUREA 500 MG PO CAPS Oral  Take 1,000 mg by mouth daily. May take with food to minimize GI side effects.     . ADULT MULTIVITAMIN W/MINERALS CH Oral Take 1 tablet by mouth daily.    . OXYCODONE HCL 5 MG PO CAPS Oral Take 2 capsules (10 mg total) by mouth every 4 (four) hours as needed. For pain 60 capsule 0  . POTASSIUM CHLORIDE ER 10 MEQ PO TBCR Oral Take 2 tablets (20 mEq total) by mouth daily. 30 tablet 0    BP  115/77  Pulse 102  Temp 98.9 F (37.2 C) (Oral)  Resp 15  SpO2 99%  Physical Exam  Nursing note and vitals reviewed. Constitutional: He is oriented to person, place, and time. He appears well-developed and well-nourished. No distress.  HENT:  Head: Normocephalic and atraumatic.  Right Ear: External ear normal.  Left Ear: External ear normal.  Mouth/Throat: No oropharyngeal exudate.  Eyes: Conjunctivae and EOM are normal. Pupils are equal, round, and reactive to light. Right eye exhibits no discharge. Left eye exhibits no discharge.  Neck: Normal range of motion. Neck supple. No JVD present. No tracheal deviation present. No thyromegaly present.  Cardiovascular: Normal rate, regular rhythm, normal heart sounds and intact distal pulses.  Exam reveals no gallop and no friction rub.   No murmur heard. Pulmonary/Chest: Effort normal and breath sounds normal. No respiratory distress. He has no wheezes. He exhibits no tenderness.  Abdominal: Soft. Bowel sounds are normal. He exhibits no distension. There is no tenderness. There is no rebound and no guarding.  Musculoskeletal: Normal range of motion. He exhibits no edema and no tenderness.       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  Neurological: He is alert and oriented to person, place, and time. No cranial nerve deficit.  Skin: Skin is warm and dry. No rash noted. He is not diaphoretic. No pallor.  Psychiatric: He has a normal mood and affect. His behavior is normal.    ED Course  Procedures (including critical care time)  Labs Reviewed  CBC - Abnormal; Notable for the following:    RBC 2.44 (*)     Hemoglobin 8.2 (*)     HCT 23.4 (*)     RDW 22.9 (*)     Platelets 566 (*)     All other components within normal limits  BASIC METABOLIC PANEL - Abnormal; Notable for the following:    BUN 5 (*)     All other  components within normal limits  RETICULOCYTES - Abnormal; Notable for the following:    Retic Ct Pct 17.4 (*)     RBC. 2.39 (*)     Retic Count, Manual 415.9 (*)     All other components within normal limits  POCT I-STAT TROPONIN I   Dg Chest 2 View  05/12/2012  *RADIOLOGY REPORT*  Clinical Data: Sickle cell crisis, chest pain  CHEST - 2 VIEW  Comparison: 05/11/2012  Findings: Upper-normal size of cardiac silhouette. Mediastinal contours and pulmonary vascularity normal. Lungs clear. No pleural effusion or pneumothorax. No acute osseous findings. Tiny nodular density likely prominent vascular marking right upper lobe unchanged since earlier study of 12/28/2011.  IMPRESSION: No acute abnormalities.  Original Report Authenticated By: Lollie Marrow, M.D.   Dg Chest 2 View  05/11/2012  *RADIOLOGY REPORT*  Clinical Data: Sickle cell crisis, chest pain  CHEST - 2  VIEW  Comparison: 03/18/2012  Findings: Prominent heart size but normal vascularity.  Negative for focal airspace process, edema, CHF, collapse, consolidation, effusion or pneumothorax.  Trachea midline.  No acute osseous finding.  IMPRESSION: No acute chest process.  Original Report Authenticated By: Judie Petit. Ruel Favors, M.D.     No diagnosis found.    MDM  26 year old patient with past medical history sickle cell disease presents with sickle cell pain crisis. Patient was seen at [facility] long yesterday for the same crisis but did not get adequate pain relief according to him and was not able to be admitted to the sickle cell pain crisis unit. Patient continues to have pain in his right chest and right shoulder similar to the pain described in previous notes. Patient denies nausea vomiting headache neck pain cough shortness of breath or fevers. Patient still tolerating by mouth is normal he appears comfortable in his room. Chest pain is right-sided not worse with exertion not worse with deep breathing cramping associated with right shoulder  typical of his previous sickle cell crises. Patient labs show baseline hemoglobin and reticulocyte count vital signs are normal no evidence of fever no evidence of infection on physical exam. Seems typical for his sickle cell crises will help treat pain.  Pain not improved in 3 doses of 2 mg of Dilaudid. Patient continues to have pain, patient will be admitted to the hospital for further pain control.  Results for orders placed during the hospital encounter of 05/12/12  CBC      Component Value Range   WBC 6.7  4.0 - 10.5 K/uL   RBC 2.44 (*) 4.22 - 5.81 MIL/uL   Hemoglobin 8.2 (*) 13.0 - 17.0 g/dL   HCT 16.1 (*) 09.6 - 04.5 %   MCV 95.9  78.0 - 100.0 fL   MCH 33.6  26.0 - 34.0 pg   MCHC 35.0  30.0 - 36.0 g/dL   RDW 40.9 (*) 81.1 - 91.4 %   Platelets 566 (*) 150 - 400 K/uL  BASIC METABOLIC PANEL      Component Value Range   Sodium 140  135 - 145 mEq/L   Potassium 3.5  3.5 - 5.1 mEq/L   Chloride 104  96 - 112 mEq/L   CO2 23  19 - 32 mEq/L   Glucose, Bld 94  70 - 99 mg/dL   BUN 5 (*) 6 - 23 mg/dL   Creatinine, Ser 7.82  0.50 - 1.35 mg/dL   Calcium 9.0  8.4 - 95.6 mg/dL   GFR calc non Af Amer >90  >90 mL/min   GFR calc Af Amer >90  >90 mL/min  POCT I-STAT TROPONIN I      Component Value Range   Troponin i, poc 0.00  0.00 - 0.08 ng/mL   Comment 3           RETICULOCYTES      Component Value Range   Retic Ct Pct 17.4 (*) 0.4 - 3.1 %   RBC. 2.39 (*) 4.22 - 5.81 MIL/uL   Retic Count, Manual 415.9 (*) 19.0 - 186.0 K/uL   DG Chest 2 View (Final result)   Result time:05/12/12 1258    Final result by Rad Results In Interface (05/12/12 12:58:11)    Narrative:   *RADIOLOGY REPORT*  Clinical Data: Sickle cell crisis, chest pain  CHEST - 2 VIEW  Comparison: 05/11/2012  Findings: Upper-normal size of cardiac silhouette. Mediastinal contours and pulmonary vascularity normal. Lungs clear. No pleural effusion or pneumothorax.  No acute osseous findings. Tiny nodular density likely  prominent vascular marking right upper lobe unchanged since earlier study of 12/28/2011.  IMPRESSION: No acute abnormalities.  Original Report Authenticated By: Lollie Marrow, M.D.    Date: 05/12/2012  Rate: 109  Rhythm: sinus tachycardia  QRS Axis: normal  Intervals: normal  ST/T Wave abnormalities: normal  Conduction Disutrbances: none  Narrative Interpretation:   Old EKG Reviewed: No significant changes noted   Case discussed with Dr. Felicity Coyer, MD 05/12/12 2206

## 2012-05-12 NOTE — Progress Notes (Signed)
Pt d/c from [facility] in stable condition. Spoke with physician at [facility] who is very familiar with his care and we would like to continue to implement  Their protocol for his care NO IVF po only  And medications  Questions Please contact Gwinda Passe NP [phone]

## 2012-05-12 NOTE — H&P (Signed)
Family Medicine Teaching [facility] Admission History and Physical  Patient name: Justin Sharp Medical record number: [id number] Date of birth: [birth year] Age: 26 y.o. Gender: male  Primary Care Provider: Fonnie Mu, MD  Chief Complaint: sickle cell crisis. Right shoulder and chest pain  History of Present Illness: Justin Sharp is a 26 y.o. 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 His usual hematologist is at [facility]. He presented initially to [facility] ED and Sickle cell clinic early this morning, had some difficulty in the setting of insurance coverage for pain crisis treatment. He was discharged with oral pain meds.  States his pain was not improved, and has persisted without help of his home medications dilaudid and oxycodone 10 mg. Pain rated 10/10 prior to evaluation, now 6-7/10 after treatment with IV dilaudid in the ED.   He denies any fever, chills, dyspnea, cough, rash, palpitations.   Patient Active Problem List  Diagnosis  . Sickle cell anemia with pain  . Situational stress  . Hypokalemia  . Priapism  . Chest pain syndrome   Past Medical History: Past Medical History  Diagnosis Date  . Sickle cell anemia   . Stroke     states cva in 2009    Past Surgical History: Past Surgical History  Procedure Date  . Appendectomy     Social History: History   Social History  . Marital Status: Single    Spouse Name: N/A    Number of Children: N/A  . Years of Education: N/A   Social History Main Topics  . Smoking status: Never Smoker   . Smokeless tobacco: None  . Alcohol Use: No  . Drug Use: No  . Sexually Active: Yes   Other Topics Concern  . None   Social History Narrative  . None    Family History: Family History  Problem Relation Age of Onset  . Diabetes Other    . Coronary artery disease Other     Allergies: Allergies  Allergen Reactions  . Sulfa Antibiotics Hives  . Sulfa Drugs Cross Reactors Hives    Current Facility-Administered Medications  Medication Dose Route Frequency Provider Last Rate Last Dose  . diphenhydrAMINE (BENADRYL) injection 25 mg  25 mg Intravenous Once Sherryl Manges, MD   25 mg at 05/12/12 1624  . diphenhydrAMINE (BENADRYL) injection 25 mg  25 mg Intravenous Once Sherryl Manges, MD   25 mg at 05/12/12 1831  . HYDROmorphone (DILAUDID) injection 1 mg  1 mg Intravenous Q3H PRN Durwin Reges, MD      . HYDROmorphone (DILAUDID) injection 2 mg  2 mg Intramuscular Once Sherryl Manges, MD   2 mg at 05/12/12 1625  . HYDROmorphone (DILAUDID) injection 2 mg  2 mg Intravenous Once Sherryl Manges, MD   2 mg at 05/12/12 1831  . HYDROmorphone (DILAUDID) injection 2 mg  2 mg Intravenous Once Sherryl Manges, MD   2 mg at 05/12/12 1908  . ondansetron (ZOFRAN) injection 4 mg  4 mg Intravenous Once Sherryl Manges, MD   4 mg at 05/12/12 1624  . sodium chloride 0.9 % bolus 1,000 mL  1,000 mL Intravenous Once Sherryl Manges, MD   1,000 mL at 05/12/12 1623  . DISCONTD: HYDROmorphone (DILAUDID) injection 2 mg  2 mg Intramuscular Once Sherryl Manges, MD       Current Outpatient  Prescriptions  Medication Sig Dispense Refill  . folic acid (FOLVITE) 1 MG tablet Take 1 mg by mouth daily.      Marland Kitchen HYDROmorphone (DILAUDID) 8 MG tablet Take 8 mg by mouth every 4 (four) hours as needed. For pain      . hydroxyurea (HYDREA) 500 MG capsule Take 1,000 mg by mouth daily. May take with food to minimize GI side effects.       . Multiple Vitamin (MULTIVITAMIN WITH MINERALS) TABS Take 1 tablet by mouth daily.      Marland Kitchen oxycodone (OXY-IR) 5 MG capsule Take 2 capsules (10 mg total) by mouth every 4 (four) hours as needed. For pain  60 capsule  0  . potassium chloride (K-DUR) 10 MEQ tablet Take 2 tablets (20 mEq total) by mouth daily.  30 tablet  0   Facility-Administered  Medications Ordered in Other Encounters  Medication Dose Route Frequency Provider Last Rate Last Dose  . diphenhydrAMINE (BENADRYL) injection 25 mg  25 mg Intravenous Once Angus Seller, PA   25 mg at 05/11/12 2118  . HYDROmorphone (DILAUDID) injection 3 mg  3 mg Intravenous Once Angus Seller, PA   3 mg at 05/11/12 2117  . HYDROmorphone (DILAUDID) injection 3 mg  3 mg Intravenous Once Angus Seller, PA   3 mg at 05/11/12 2200  . HYDROmorphone (DILAUDID) injection 3 mg  3 mg Intravenous Once Leanne Chang, NP   3 mg at 05/12/12 1610  . potassium chloride SA (K-DUR,KLOR-CON) CR tablet 40 mEq  40 mEq Oral Once Angus Seller, PA   40 mEq at 05/11/12 2234  . DISCONTD: acetaminophen (TYLENOL) suppository 650 mg  650 mg Rectal Q4H PRN Roma Kayser Schorr, NP      . DISCONTD: acetaminophen (TYLENOL) tablet 650 mg  650 mg Oral Q4H PRN Roma Kayser Schorr, NP      . DISCONTD: alum & mag hydroxide-simeth (MAALOX/MYLANTA) 200-200-20 MG/5ML suspension 15-30 mL  15-30 mL Oral Q4H PRN Roma Kayser Schorr, NP      . DISCONTD: dextrose 5 %-0.45 % sodium chloride infusion   Intravenous Continuous Roma Kayser Schorr, NP 125 mL/hr at 05/12/12 0144    . DISCONTD: diphenhydrAMINE (BENADRYL) capsule 25-50 mg  25-50 mg Oral Q4H PRN Roma Kayser Schorr, NP      . DISCONTD: diphenhydrAMINE (BENADRYL) injection 12.5-25 mg  12.5-25 mg Intravenous Q4H PRN Roma Kayser Schorr, NP   25 mg at 05/12/12 0630  . DISCONTD: folic acid (FOLVITE) tablet 1 mg  1 mg Oral Daily Roma Kayser Schorr, NP   1 mg at 05/12/12 0949  . DISCONTD: HYDROcodone-acetaminophen (NORCO/VICODIN) 5-325 MG per tablet 1-2 tablet  1-2 tablet Oral Q4H PRN Leanne Chang, NP   2 tablet at 05/12/12 0245  . DISCONTD: HYDROmorphone (DILAUDID) injection 2 mg  2 mg Intravenous Q2H PRN Leanne Chang, NP   2 mg at 05/12/12 0430  . DISCONTD: HYDROmorphone (DILAUDID) tablet 2-4 mg  2-4 mg Oral Q3H PRN Grayce Sessions, NP   4 mg at 05/12/12 0907  .  DISCONTD: hydroxyurea (HYDREA) capsule 500 mg  500 mg Oral BID Roma Kayser Schorr, NP   500 mg at 05/12/12 0949  . DISCONTD: ondansetron (ZOFRAN) injection 4 mg  4 mg Intravenous Q4H PRN Leanne Chang, NP   4 mg at 05/12/12 0144  . DISCONTD: ondansetron (ZOFRAN) tablet 4 mg  4 mg Oral Q4H PRN Leanne Chang, NP      .  DISCONTD: potassium chloride 20 MEQ/15ML (10%) liquid 40 mEq  40 mEq Oral Daily Leanne Chang, NP       Review Of Systems: Per HPI with the following additions: no dysuria, constipation, abd pain, diarrhea, rash, headache,  Otherwise 12 point review of systems was performed and was unremarkable.  Physical Exam: Pulse: 79  Blood Pressure: 112/74 RR: 14   O2: 99 on RA Temp: 98.8  General: alert, cooperative, appears stated age, no distress and ambulatory with IV pole HEENT: PERRLA, extra ocular movement intact, oropharynx clear, no lesions and neck supple with midline trachea Heart: S1, S2 normal, no murmur, rub or gallop, regular rate and rhythm Lungs: clear to auscultation, no wheezes or rales and unlabored breathing Abdomen: abdomen is soft without significant tenderness, masses, organomegaly or guarding Extremities: extremities normal, atraumatic, no cyanosis or edema Skin:no rashes Neurology: normal without focal findings, mental status, speech normal, alert and oriented x3, PERLA, muscle tone and strength normal and symmetric and gait and station normal  Labs and Imaging: Lab Results  Component Value Date/Time   NA 140 05/12/2012 12:16 PM   K 3.5 05/12/2012 12:16 PM   CL 104 05/12/2012 12:16 PM   CO2 23 05/12/2012 12:16 PM   BUN 5* 05/12/2012 12:16 PM   CREATININE 0.58 05/12/2012 12:16 PM   GLUCOSE 94 05/12/2012 12:16 PM   Lab Results  Component Value Date   WBC 6.7 05/12/2012   HGB 8.2* 05/12/2012   HCT 23.4* 05/12/2012   MCV 95.9 05/12/2012   PLT 566* 05/12/2012   CXR: Upper-normal size of cardiac silhouette.  Mediastinal contours and pulmonary vascularity  normal.  Lungs clear.  No pleural effusion or pneumothorax.  No acute osseous findings.  Tiny nodular density likely prominent vascular marking right upper  lobe unchanged since earlier study of 12/28/2011.  IMPRESSION:  No acute abnormalities.  ED ECG REPORT   Date: 05/12/2012  EKG Time: 9:28 PM  Rate: 112  Rhythm: normal sinus rhythm,  normal EKG, normal sinus rhythm  Axis: normal  Intervals:none  ST&T Change: none  Narrative Interpretation: normal      Assessment and Plan: Justin Sharp is a 26 y.o. year old male with Hgb SS disease presenting with vaso-occlusive pain crisis.   1. Pain crisis. Pain is typical for patient in right shoulder and chest wall. No evidence acute chest or ACS. Hgb is stable near recent values. Oral pain control not adequate per patient. He is tolerating oral medications, has received multiple doses dilaudid in ED with mild improvement. Continue home oxycodone and dilaudid PO. Toradol 30 mg IV q 6hr x 5 doses scheduled. Tylenol prn. Dilaudid 1mg  q3 hr prn severe breakthrough pain. Attempt contact with primary hematologist in am at [facility] Continue hydroxyurea.   2. FEN/GI: regular diet. IVF NS at 100cc/hr for hydration. Repeat BMET in am.   3. Prophylaxis: heparin SQ q 8 hrs.   4. Disposition: Asked to admit for pain control overnight and IV hydration. Patient states his insurance would not cover treatment at [facility] clinic, and he was discharged from there. Unsure regarding the specific protocol for this patient's oral meds, will contact [facility] in am.  Plan for change to oral medications as soon as able, hopefully tomorrow can return to home.  Lloyd Huger, MD [facility] Resident - PGY-3 05/12/2012 9:41 PM

## 2012-05-12 NOTE — ED Notes (Signed)
Charge RN called by pt's PMD and told that pt has been to all facilities and worked up within the last cple days. Requested not to give pt pain meds.

## 2012-05-12 NOTE — Progress Notes (Addendum)
Patient ID: Justin Sharp, male   DOB: 1986-09-25, 26 y.o.   MRN: [id number] [facility] History and Physical  Justin Sharp WUJ:[id number] DOB: 01/04/1986 DOA: 05/12/2012  Referring physician: ED physician PCP: Justin Mu, Sharp   Chief Complaint: Sickle cell pain  HPI: Mr Justin Sharp is a pleasant 26 y/o [place] American gentleman who presented to the ED tonight w/ c/o increased, persistent (R) chest and (R) shoulder pain that has not been controlled w/ his usual home pain control regimen of  PO Dilaudid and Oxycodone. After evaluation in the ED it was felt pt was appropriate for admission to the [facility]) for 23 hr observation and pain management. [facility] was consulted to admit to [facility].  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 He feels the (R) CP is an extension of the (R) shoulder pain and  denies SOB, cough, fever or other associated c/o's. Denies any recent illnesses. He does admit to new situational stressors w/ his fiance. Pt states he had a "stroke" in 2009 that left him w/ mild (R) sided weakness. Also admits to problems with priapism in past but not at this time.  He is a pt of Dr Justin Sharp at [facility] and believes he saw him last in March. Current pain score 7-8/10.  Assessment and Plan: Active Problems: 1. Sickle cell anemia with pain: Admit to [facility] for 23 hr observation for pain management w/ IV Dilaudid, hydration, repeat cbc in am. 2. Hypokalemia (Mild): Repleted, repeat B-Met in am 3. Situational stress    Code Status: Full Family Communication: Pt at bedside Disposition Plan: PT evaluation    Review of Systems:  Constitutional: Negative for fever, chills and malaise/fatigue. Negative for diaphoresis.  HENT: Negative for hearing loss, ear pain, nosebleeds, congestion, sore throat, neck pain, tinnitus and ear discharge.   Eyes: Negative for blurred vision, double  vision, photophobia, pain, discharge and redness.  Respiratory: Negative for cough, hemoptysis, sputum production, shortness of breath, wheezing and stridor.   Cardiovascular: Negative for chest pain, palpitations, orthopnea, claudication and leg swelling.  Gastrointestinal: Negative for nausea, vomiting and abdominal pain. Negative for heartburn, constipation, blood in stool and melena.  Genitourinary: Negative for dysuria, urgency, frequency, hematuria and flank pain.  Musculoskeletal: Negative for myalgias, back pain, joint pain and falls.  Skin: Negative for itching and rash.  Neurological: Negative for dizziness and weakness. Negative for tingling, tremors, sensory change, speech change, focal weakness, loss of consciousness and headaches.  Endo/Heme/Allergies: Negative for environmental allergies and polydipsia. Does not bruise/bleed easily.  Psychiatric/Behavioral: Negative for suicidal ideas. The patient is not nervous/anxious.      Past Medical History  Diagnosis Date  . Sickle cell anemia   . Stroke     states cva in 2009    Past Surgical History  Procedure Date  . Appendectomy     Social History:  reports that he has never smoked. He does not have any smokeless tobacco history on file. He reports that he does not drink alcohol or use illicit drugs.  Allergies  Allergen Reactions  . Sulfa Antibiotics Hives  . Sulfa Drugs Cross Reactors Hives    Family History  Problem Relation Age of Onset  . Diabetes Other   . Coronary artery disease Other     Prior to Admission medications   Medication Sig Start Date End Date Taking? Authorizing Provider  HYDROmorphone (DILAUDID) 8 MG tablet  Take 1 tablet (8 mg total) by mouth every 4 (four) hours as needed. 03/23/12   Justin Sharp  hydroxyurea (HYDREA) 500 MG capsule Take 1,000 mg by mouth daily. May take with food to minimize GI side effects.     Historical Provider, Sharp  Multiple Vitamin (MULTIVITAMIN WITH MINERALS)  TABS Take 1 tablet by mouth daily.    Historical Provider, Sharp  oxycodone (OXY-IR) 5 MG capsule Take 2 capsules (10 mg total) by mouth every 4 (four) hours as needed. For pain 01/20/12   Justin Sharp  potassium chloride (K-DUR) 10 MEQ tablet Take 2 tablets (20 mEq total) by mouth daily. 01/14/12 01/13/13  Justin Sharp    Physical Exam: Filed Vitals:   05/12/12 0038  BP: 115/73  Pulse: 99  Temp: 98.4 F (36.9 C)  TempSrc: Oral  Resp: 18  Height: 6\' 1"  (1.854 m)  Weight: 72.576 kg (160 lb)  SpO2: 99%    Physical Exam  Constitutional: Appears well-developed and well-nourished. No acute distress.  HENT: Normocephalic. External right and left ear normal. Oropharynx is clear and moist.  Eyes: Conjunctivae and EOM are normal. PERRLA, no scleral icterus.  Neck: Normal ROM. Neck supple. No JVD. No tracheal deviation. No thyromegaly.  CVS: RRR, S1/S2 +, no murmurs, no gallops, no carotid bruit.  Pulmonary: Effort and breath sounds normal, no stridor, rhonchi, wheezes, rales.  Abdominal: Soft. BS +,  no distension, tenderness, rebound or guarding.  Musculoskeletal: Normal range of motion. No edema and no tenderness.  Lymphadenopathy: No lymphadenopathy noted, cervical, inguinal. Neuro: Alert, muscle tone coordination. No cranial nerve deficit. Skin: Skin is warm and dry. No rash noted. Not diaphoretic. No erythema. No pallor.  Psychiatric: Normal mood and affect. Behavior, judgment, thought content normal.   Labs on Admission:  Basic Metabolic Panel:  Lab 05/11/12 1610  NA 140  K 3.2*  CL 104  CO2 25  GLUCOSE 114*  BUN 6  CREATININE 0.53  CALCIUM 9.6  MG --  PHOS --   Liver Function Tests: No results found for this basename: AST:5,ALT:5,ALKPHOS:5,BILITOT:5,PROT:5,ALBUMIN:5 in the last 168 hours No results found for this basename: LIPASE:5,AMYLASE:5 in the last 168 hours No results found for this basename: AMMONIA:5 in the last 168 hours CBC:  Lab 05/11/12 2120    WBC 7.1  NEUTROABS 5.9  HGB 9.1*  HCT 25.5*  MCV 95.9  PLT 674*   Cardiac Enzymes: No results found for this basename: CKTOTAL:5,CKMB:5,CKMBINDEX:5,TROPONINI:5 in the last 168 hours BNP: No components found with this basename: POCBNP:5 CBG: No results found for this basename: GLUCAP:5 in the last 168 hours  Radiological Exams on Admission: Dg Chest 2 View  05/11/2012  *RADIOLOGY REPORT*  Clinical Data: Sickle cell crisis, chest pain  CHEST - 2 VIEW  Comparison: 03/18/2012  Findings: Prominent heart size but normal vascularity.  Negative for focal airspace process, edema, CHF, collapse, consolidation, effusion or pneumothorax.  Trachea midline.  No acute osseous finding.  IMPRESSION: No acute chest process.  Original Report Authenticated By: Judie Petit. Ruel Favors, M.D.    EKG: Sinus Tachycardia, no ST/T wave changes  Maly Lemarr, Roma Kayser, Sharp-C  [facility] Pager [phone]  If 7PM-7AM, please contact night-coverage [email or web address] Password [facility] 05/12/2012, 1:38 AM

## 2012-05-13 ENCOUNTER — Telehealth: Payer: Self-pay | Admitting: *Deleted

## 2012-05-13 LAB — MRSA PCR SCREENING: MRSA by PCR: NEGATIVE

## 2012-05-13 MED ORDER — ZOLPIDEM TARTRATE 5 MG PO TABS
5.0000 mg | ORAL_TABLET | Freq: Once | ORAL | Status: AC
  Administered 2012-05-13: 5 mg via ORAL
  Filled 2012-05-13: qty 1

## 2012-05-13 MED ORDER — OXYCODONE HCL 5 MG PO TABS: 10.0000 mg | ORAL_TABLET | ORAL | Status: DC | PRN

## 2012-05-13 NOTE — Progress Notes (Signed)
PGY-1 Daily Progress Note Family Medicine Teaching Service [employer] R. Adamari Frede, DO Service Pager: [phone]   Subjective: Pt seen and examined at bedside.  His pain is much better than coming in and is not having CP, SOB, extremity pain, HA, N/V/D.  Objective:  VITALS Temp:  [97.9 F (36.6 C)-98.8 F (37.1 C)] 98.4 F (36.9 C) (07/25 0900) Pulse Rate:  [72-89] 72  (07/25 0900) Resp:  [17-18] 18  (07/25 0900) BP: (103-116)/(64-80) 103/64 mmHg (07/25 0900) SpO2:  [95 %-98 %] 96 % (07/25 0900) Weight:  [160 lb 0.9 oz (72.6 kg)] 160 lb 0.9 oz (72.6 kg) (07/24 2245)  In/Out  Intake/Output Summary (Last 24 hours) at 05/13/12 1257 Last data filed at 05/13/12 0900  Gross per 24 hour  Intake    715 ml  Output      0 ml  Net    715 ml    Physical Exam: Gen:  NAD HEENT: moist mucous membranes Neck: IJ in place at time of exam, no erythema, or edema present CV: Regular rate and rhythm, no murmurs rubs or gallops PULM: clear to auscultation bilaterally. No wheezes/rales/rhonchi ABD: soft/nontender/nondistended/normal bowel sounds EXT: No edema Neuro: Alert and oriented x3  MEDS Scheduled Meds:    . diphenhydrAMINE  25 mg Intravenous Once  . diphenhydrAMINE  25 mg Intravenous Once  . docusate sodium  100 mg Oral BID  . folic acid  1 mg Oral Daily  . heparin  5,000 Units Subcutaneous Q8H  .  HYDROmorphone (DILAUDID) injection  2 mg Intramuscular Once  .  HYDROmorphone (DILAUDID) injection  2 mg Intravenous Once  .  HYDROmorphone (DILAUDID) injection  2 mg Intravenous Once  . hydroxyurea  1,000 mg Oral Daily  . ketorolac  30 mg Intravenous Q6H  . ondansetron (ZOFRAN) IV  4 mg Intravenous Once  . potassium chloride  20 mEq Oral Daily  . sodium chloride  1,000 mL Intravenous Once  . zolpidem  5 mg Oral Once  . DISCONTD: folic acid  1 mg Oral Daily  . DISCONTD:  HYDROmorphone (DILAUDID) injection  2 mg Intramuscular Once   Continuous Infusions:    . DISCONTD: sodium chloride 100  mL/hr at 05/13/12 0016   PRN Meds:.acetaminophen, acetaminophen, HYDROmorphone, ondansetron (ZOFRAN) IV, ondansetron, oxyCODONE, DISCONTD:  HYDROmorphone (DILAUDID) injection, DISCONTD: oxycodone  Labs and imaging:   CBC  Lab 05/12/12 1216 05/12/12 0425 05/11/12 2120  WBC 6.7 8.5 7.1  HGB 8.2* 7.4* 9.1*  HCT 23.4* 21.3* 25.5*  PLT 566* 556* 674*   BMET/CMET  Lab 05/12/12 1216 05/12/12 0906 05/12/12 0425  NA 140 139 140  K 3.5 3.7 3.5  CL 104 104 106  CO2 23 25 24   BUN 5* 5* 6  CREATININE 0.58 0.57 0.49*  CALCIUM 9.0 8.8 8.6  PROT -- 7.4 --  BILITOT -- 1.2 --  ALKPHOS -- 74 --  ALT -- 15 --  AST -- 19 --  GLUCOSE 94 97 99   Results for orders placed during the hospital encounter of 05/12/12 (from the past 24 hour(s))  MRSA PCR SCREENING     Status: Normal   Collection Time   05/13/12  2:04 AM      Component Value Range   MRSA by PCR NEGATIVE  NEGATIVE   Dg Chest 2 View  05/12/2012    IMPRESSION: No acute abnormalities.  Original Report Authenticated By: Lollie Marrow, M.D.   Dg Chest 2 View  05/11/2012    IMPRESSION: No acute chest  process.  Original Report Authenticated By: Judie Petit. Ruel Favors, M.D.        Assessment  Justin Sharp is a 26 y.o. year old male with Hgb SS disease presenting with right shoulder and chest wall pain c/w previous sickle cell crises.   Plan:  1. Sickle Cell Pain crisis. Pt states he was having pain in his shoulder and "all over".  He went to [facility] ED where they would not give him IV pain medications so he came to [facility] ED and was given IV Dilaudid, Oxy IR, and PO dilaudid.  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  2) Pt had an IJ placed on admission due to hard peripheral stick.  He received dilaudid one time over night and was continued on his home oxy IR and Dilaudid PO.  3)  Talked to his NP at sickle cell clinic and states pt was recently kicked out of [facility] sickle cell program due to multiple issues.    4) Will discuss with his NP in regard to transferring back to Dr. Diamantina Providence service at [facility]  2) Malingering - Reviewed previous hospital d/c summary from 03/18/2012.  According to this chart, pt has had three episodes in the past couple years in which he was admitted to the hospital in [place], [facility], and [facility] in which he was treated for sickle cell crisis and found on the day of d/c to have fever and leukocytosis.  BCx were done and each time S. Viridans was found.  An echo was done each time and did not show endocarditis.  Per NP, pt may have injected foreign stubstances into his IV to continue to remain in the hospital  1) For the above reasoning, will d/c his EJ and all IV medications.  2) Will continue to watch over the next 24 hrs to make sure pt does not become febrile, as he was found to be in the bathroom with his IV while in the ED  3) Will get BCx if becomes febrile or shows an elevated WBC.  FEN/GI: regular diet. No IVF 3. Prophylaxis: heparin SQ q 8 hrs.  4. Disposition: Will talk to Pt's NP, Ms. Gwinda Passe Pager # [phone], in regards to him seeing Dr. August Saucer.  Also told pt to call and make an appointment with his PCP, Dr. Fonnie Mu from [facility] @ [phone]    Twana First. Lokelani Lutes DO, FMTS PGY-1

## 2012-05-13 NOTE — ED Provider Notes (Signed)
I saw and evaluated the patient, reviewed the resident's note and I agree with the findings and plan.  The patient's pain is difficult to control emergency department.  He will require admission for ongoing treatment of his sickle cell crisis pain  1. Sickle cell pain crisis      Lyanne Co, MD 05/13/12 [phone]

## 2012-05-13 NOTE — H&P (Signed)
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  Pain across R shoulder and chest, initially started Sat 7/20 and became worse on 7/21, with measured temperature of 100.7F at that time.  No fevers since. Has had some dyspnea since pain onset, but denies cough, sputum production, nausea/vomiting, dysuria, diarrhea, or redness/lesions on skin.  He reports his pain has improved with iv dilaudid and ivf hydration, which he began receiving in [facility] ED and has received IVF since admission through R IJ line.   Was admitted at [facility] for bacteremia in June, completed course of abx while there and discharged sometime in early/mid July.  The details and hospital documentation are not available to me at the time of this note.  Patient acknowledges that he has been under the care of the [facility] at [facility] for a number of years, but that issues over the years have made him want to seek care elsewhere due to differences of opinion regarding pain management.  He acknowledges that he had an opioid addiction problem, as did/does his brother, who is also treated at [facility] for sickle cell disease.  Patient reports his addiction problem arose after being treated for his SS disease and after his stroke in 2009, and that he went through an inpatient detox program in [place].  He reports a history of some mild depression associated with this, was tried on Prozac and a BNZ without notable clinical benefit, and the depression meds were discontinued.  He continues with regular  spiritual counseling with his pastor around the issues of substance abuse.  Reports last inappropriate opioid use was around "holiday times 2012".  Repeatedly denies any illicit substance use, including THC and cocaine.  Denies any alcohol use.  Had smoked cigarettes for a brief while when he was 26 years old, not currently a smoker.  Engaged to be married, recent grad from [employer] with major in [place].  Looking to move out of parents' house in [place], [place] in [place] he has extended family.   Exam Well appearing, no apparent distress. IJ line in place dry and clean. No cervical adenopathy COR regular S1S2, no extra sounds PULM Clear bilaterally no rales or wheezes.  ABD Soft, nontender EXTS: nontender, no skin changes, no calf tenderness. Palpable dp pulses bilaterally.   Assess/Plan: Patient admitted with vaso-occlusive crisis pain that is characteristic of his usual pain presentation, per his report. There are no signs of systemic infection as a backdrop for this crisis.  In addition to patient's report of opioid addiction in the past, we have received reports alleging inappropriate behavior related to his iv lines during other episodes of care at other institutions.  I have told the patient that we will be performing drug tox screens during this admission, and our plan is to continue oral meds for pain control and to try and improve his oral food/hydration intake.  Discontinue nasal canula oxygen if not needed by pulse oximetry testing on room air.  He is amenable to establishing sickle cell care  with the [facility] at [facility], as he anticipates being in the [place] after his [place].  Paula Compton, MD

## 2012-05-13 NOTE — Telephone Encounter (Addendum)
Pager number given was [phone]. Discussed with inpt team, Dr. Paulina Fusi PGY-1 who is following pt while admitted and Dr. Jennette Kettle who is the attending. Will likely D/C to home after managing current acute problems with follow-up at home in [employer]. Thank you for helping [place] coordinate the care of this patient! -- CMS

## 2012-05-13 NOTE — Telephone Encounter (Signed)
Received call from Gwinda Passe, NP.  [facility] resident on call yesterday was Dr. Archie Balboa.  Not sure if she is on call today, but resident pager number is [phone].  Called Dr. Maryjean Ka (inpatient resident) and informed of pager number.  Gaylene Brooks, RN

## 2012-05-13 NOTE — Progress Notes (Signed)
Discussed discharge instructions with pt. Pt discharged to home. Sitter walked pt downstairs. Pt showed no barriers to discharge. IV removed. Assessment unchanged from morning.

## 2012-05-13 NOTE — Discharge Summary (Signed)
[facility] Discharge Summary   Patient ID: Justin Sharp MRN: [id number] DOB/AGE: 1986/08/14 26 y.o.  Admit date: 05/11/2012 Discharge date: 05/13/2012  Primary Care Physician:  Justin Mu, MD  Admission Diagnoses:  Active Problems: Chronic Pain  Narcotic abuse  Sickle cell disease  Discharge Diagnoses:   Chronic Pain  Narcotic abuse  Sickle cell disease    Discharge Medications:  Medication List  As of 05/13/2012  6:21 AM   ASK your doctor about these medications         hydroxyurea 500 MG capsule   Commonly known as: HYDREA   Take 1,000 mg by mouth daily. May take with food to minimize GI side effects.      multivitamin with minerals Tabs   Take 1 tablet by mouth daily.      oxycodone 5 MG capsule   Commonly known as: OXY-IR   Take 2 capsules (10 mg total) by mouth every 4 (four) hours as needed. For pain      potassium chloride 10 MEQ tablet   Commonly known as: K-DUR   Take 2 tablets (20 mEq total) by mouth daily.             Consults:  None  Significant Diagnostic Studies:  Dg Chest 2 View  05/12/2012  *RADIOLOGY REPORT*  Clinical Data: Sickle cell crisis, chest pain  CHEST - 2 VIEW  Comparison: 05/11/2012  Findings: Upper-normal size of cardiac silhouette. Mediastinal contours and pulmonary vascularity normal. Lungs clear. No pleural effusion or pneumothorax. No acute osseous findings. Tiny nodular density likely prominent vascular marking right upper lobe unchanged since earlier study of 12/28/2011.  IMPRESSION: No acute abnormalities.  Original Report Authenticated By: Justin Sharp, M.D.   Dg Chest 2 View  05/11/2012  *RADIOLOGY REPORT*  Clinical Data: Sickle cell crisis, chest pain  CHEST - 2 VIEW  Comparison: 03/18/2012  Findings: Prominent heart size but normal vascularity.  Negative for focal airspace process, edema, CHF, collapse, consolidation, effusion or pneumothorax.  Trachea midline.  No acute osseous finding.  IMPRESSION: No  acute chest process.  Original Report Authenticated By: Judie Petit. Justin Sharp, M.D.     [facility] Course:  For complete details please refer to admission H and Sharp, but in brief, [name] was admitted during the night for sickle cell like crisis pain. He was given I in ED DILAUDID IV which work but with extensive hx and contact with [facility] hosp will use the same protocol they use for him no IV access or IV pain medications. Review scan documentation/notes from them.   Physical Exam at Discharge:  BP 123/71  Pulse 88  Temp 98.8 F (37.1 C) (Oral)  Resp 14  SpO2 100%  Gen: Alert oriented no acute distress well nourished normal habitus  Cardiovascular: normal S1 and 2 no rubs murmurs gallops Respiratory:  CTA Gastrointestinal: Soft non tender bowel sounds present in all 4 quadrants  Extremities: full ROM   Disposition at Discharge: 01-Home and Self Care  Discharge Orders: see d/c instructions  Condition at Discharge:   Stable  Time spent on Discharge:  Greater than 1:30 minutes.  Signed: EDWARDS, MICHELLE Sharp 05/13/2012, 6:21 AM

## 2012-05-13 NOTE — Discharge Summary (Signed)
Physician Discharge Summary  Patient ID: Justin Sharp MRN: [id number] DOB: 05-Sep-1986 Age: 26 y.o.  Admit date: 05/12/2012 Discharge date: 05/13/2012 Admitting Physician: Barbaraann Barthel, MD  PCP: Fonnie Mu, MD  Consultants:None     Discharge Diagnosis: Principal Problem:  *Sickle cell anemia with pain    Hospital Course Justin Sharp is a 26 y.o. year old male with Hgb SS disease presenting with right shoulder and chest wall pain c/w previous sickle cell crises  1) Sickle Cell Pain crisis. Pt states he was having pain in his shoulder and "all over". He went to [facility] ED where they would not give him IV pain medications so he came to [facility] ED and was given IV Dilaudid, Oxy IR, and PO dilaudid. He was admitted and watched overnight   1) Pt has extensive PMHx for sickle cell disease, previous pain crisis, previous acute chest, and opioid addiction secondary to chronic pain medications for his sickle.   2) Pt had an IJ placed on admission due to hard peripheral stick. He received dilaudid one time over night and was continued on his home oxy IR and Dilaudid PO.   3) Talked to his NP at sickle cell clinic and states pt was recently kicked out of [facility]'s sickle cell program due to multiple issues and recommended to have nothing given by IV due to previous possible episodes of foreign substances being injected into IV site by pt.  4) Pt was stable on admission and had decreased pain, as well.  2) Malingering - Reviewed previous hospital d/c summary from 03/18/2012. According to this chart, pt has had three episodes in the past couple years in which he was admitted to the hospital in [place], [facility], and [facility] in which he was treated for sickle cell crisis and found on the day of d/c to have fever and leukocytosis. BCx were done and each time S. Viridans was found. An echo was done each time and did not show endocarditis. Per NP, pt may have injected foreign stubstances into his IV to continue to remain  in the hospital   1) For the above reasoning, his EJ and all IV medications were discontinued.   2) Pt had no leukcytosis or febrile at time of d/c.  He asked to be d/c since he felt he was not having anything further done in the hospital.         Discharge PE   Filed Vitals:   05/13/12 1355  BP: 101/65  Pulse: 102  Temp: 98.4 F (36.9 C)  Resp: 18   Physical Exam Gen: NAD, WN/WD HEENT: moist mucous membranes  Neck: IJ in place at time of exam, no erythema, or edema present  CV: Regular rate and rhythm, no murmurs rubs or gallops  PULM: clear to auscultation bilaterally. No wheezes/rales/rhonchi  ABD: soft/nontender/nondistended/normal bowel sounds  EXT: No edema  Neuro: Alert and oriented x3      Procedures/Imaging:  Dg Chest 2 View  05/12/2012   IMPRESSION: No acute abnormalities.  Original Report Authenticated By: Lollie Marrow, M.D.   Dg Chest 2 View  05/11/2012   IMPRESSION: No acute chest process.  Original Report Authenticated By: Judie Petit. Ruel Favors, M.D.    Labs  CBC  Lab 05/12/12 1216 05/12/12 0425 05/11/12 2120  WBC 6.7 8.5 7.1  HGB 8.2* 7.4* 9.1*  HCT 23.4* 21.3* 25.5*  PLT 566* 556* 674*   BMET  Lab 05/12/12 1216 05/12/12 0906 05/12/12 0425  NA 140 139 140  K  3.5 3.7 3.5  CL 104 104 106  CO2 23 25 24   BUN 5* 5* 6  CREATININE 0.58 0.57 0.49*  CALCIUM 9.0 8.8 8.6  PROT -- 7.4 --  BILITOT -- 1.2 --  ALKPHOS -- 74 --  ALT -- 15 --  AST -- 19 --  GLUCOSE 94 97 99   Results for orders placed during the hospital encounter of 05/12/12 (from the past 72 hour(s))  CBC     Status: Abnormal   Collection Time   05/12/12 12:16 PM      Component Value Range Comment   WBC 6.7  4.0 - 10.5 K/uL    RBC 2.44 (*) 4.22 - 5.81 MIL/uL    Hemoglobin 8.2 (*) 13.0 - 17.0 g/dL    HCT 16.1 (*) 09.6 - 52.0 %    MCV 95.9  78.0 - 100.0 fL    MCH 33.6  26.0 - 34.0 pg    MCHC 35.0  30.0 - 36.0 g/dL    RDW 04.5 (*) 40.9 - 15.5 %    Platelets 566 (*) 150 - 400 K/uL     BASIC METABOLIC PANEL     Status: Abnormal   Collection Time   05/12/12 12:16 PM      Component Value Range Comment   Sodium 140  135 - 145 mEq/L    Potassium 3.5  3.5 - 5.1 mEq/L    Chloride 104  96 - 112 mEq/L    CO2 23  19 - 32 mEq/L    Glucose, Bld 94  70 - 99 mg/dL    BUN 5 (*) 6 - 23 mg/dL    Creatinine, Ser 8.11  0.50 - 1.35 mg/dL    Calcium 9.0  8.4 - 91.4 mg/dL    GFR calc non Af Amer >90  >90 mL/min    GFR calc Af Amer >90  >90 mL/min   RETICULOCYTES     Status: Abnormal   Collection Time   05/12/12 12:16 PM      Component Value Range Comment   Retic Ct Pct 17.4 (*) 0.4 - 3.1 %    RBC. 2.39 (*) 4.22 - 5.81 MIL/uL    Retic Count, Manual 415.9 (*) 19.0 - 186.0 K/uL   POCT I-STAT TROPONIN I     Status: Normal   Collection Time   05/12/12 12:39 PM      Component Value Range Comment   Troponin i, poc 0.00  0.00 - 0.08 ng/mL    Comment 3            MRSA PCR SCREENING     Status: Normal   Collection Time   05/13/12  2:04 AM      Component Value Range Comment   MRSA by PCR NEGATIVE  NEGATIVE        Patient condition at time of discharge/disposition: stable  Disposition-home   Follow up issues: 1. Please follow up on pt's pain and how it is controlled on PO meds.  2.  Please follow up on patient's social issues, as many of his hospital complaints may be related to opioid misuse.  Discharge follow up:    Discharge Instructions: Please refer to Patient Instructions section of EMR for full details.  Patient was counseled important signs and symptoms that should prompt return to medical care, changes in medications, dietary instructions, activity restrictions, and follow up appointments.  Significant instructions noted below:   Discharge Orders    Future Orders Please Complete By Expires  Increase activity slowly      Call MD for:  temperature >100.4      Call MD for:  persistant nausea and vomiting      Call MD for:  severe uncontrolled pain      Call MD for:   redness, tenderness, or signs of infection (pain, swelling, redness, odor or green/yellow discharge around incision site)      Call MD for:  difficulty breathing, headache or visual disturbances      Call MD for:  persistant dizziness or light-headedness          Discharge Medications Medication List  As of 05/13/2012  3:25 PM   CONTINUE taking these medications         folic acid 1 MG tablet   Commonly known as: FOLVITE      HYDROmorphone 8 MG tablet   Commonly known as: DILAUDID      hydroxyurea 500 MG capsule   Commonly known as: HYDREA      multivitamin with minerals Tabs      oxycodone 5 MG capsule   Commonly known as: OXY-IR   Take 2 capsules (10 mg total) by mouth every 4 (four) hours as needed. For pain      potassium chloride 10 MEQ tablet   Commonly known as: K-DUR   Take 2 tablets (20 mEq total) by mouth daily.                Gildardo Cranker, DO of [facility] 05/13/2012 3:12 PM

## 2012-05-13 NOTE — Progress Notes (Signed)
Update: Spoke w/ Ms. Gwinda Passe in regard to this patient.  She saw pt yesterday and d/c him from [facility] because he wanted IV meds and was not in acute crisis.  Recommended d/c tomorrow while having constant supervision and no IV.  Will watch overnight and d/c tomorrow for him to f/u at his PCP in [place].  Wilmot R. Nyheim Seufert 05/12/12 1:21 PM

## 2012-05-13 NOTE — Progress Notes (Signed)
Just got phone call from patient's NP Gwinda Passe stating that patient has h/o being discharged from clinic and [facility]'s SS progarm because of manipulating IV- patient has injected things into access so that he ends up with bacteremia in the past.  Per Gwinda Passe, pain protocol in past has been:  - PO dilaudid - PO oxycodone - benadry/zofran - sitter 24hr/day, even when pt going to bathroom - psychiatrist to follow  She is pageable at [phone] and can be called in her office at [phone].  She is dropping off paperwork regarding patient's medical history at [facility] today.  Call to get number for [facility].  Simone Curia 05/13/2012 7:29 AM

## 2012-05-13 NOTE — Discharge Instructions (Signed)
Sickle Cell Pain Crisis Sickle cell anemia requires regular medical attention by your healthcare provider and awareness about when to seek medical care. Pain is a common problem in children with sickle cell disease. This usually starts at less than 26 year of age. Pain can occur nearly anywhere in the body but most commonly occurs in the extremities, back, chest, or belly (abdomen). Pain episodes can start suddenly or may follow an illness. These attacks can appear as decreased activity, loss of appetite, change in behavior, or simply complaints of pain. DIAGNOSIS   Specialized blood and gene testing can help make this diagnosis early in the disease. Blood tests may then be done to watch blood levels.   Specialized brain scans are done when there are problems in the brain during a crisis.   Lung testing may be done later in the disease.  HOME CARE INSTRUCTIONS   Maintain good hydration. Increase you or your child's fluid intake in hot weather and during exercise.   Avoid smoking. Smoking lowers the oxygen in the blood and can cause the production of sickle-shaped cells (sickling).   Control pain. Only take over-the-counter or prescription medicines for pain, discomfort, or fever as directed by your caregiver. Do not give aspirin to children because of the association with Reye's syndrome.   Keep regular health care checks to keep a proper red blood cell (hemoglobin) level. A moderate anemia level protects against sickling crises.   You and your child should receive all the same immunizations and care as the people around you.   Mothers should breastfeed their babies if possible. Use formulas with iron added if breastfeeding is not possible. Additional iron should not be given unless there is a lack of it. People with sickle cell disease (SCD) build up iron faster than normal. Give folic acid and additional vitamins as directed.   If you or your child has been prescribed antibiotics or other  medications to prevent problems, take them as directed.   Summer camps are available for children with SCD. They may help young people deal with their disease. The camps introduce them to other children with the same problem.   Young people with SCD may become frustrated or angry at their disease. This can cause rebellion and refusal to follow medical care. Help groups or counseling may help with these problems.   Wear a medical alert bracelet. When traveling, keep medical information, caregiver's names, and the medications you or your child takes with you at all times.  SEEK IMMEDIATE MEDICAL CARE IF:   You or your child develops dizziness or fainting, numbness in or difficulty with movement of arms and legs, difficulty with speech, or is acting abnormally. This could be early signs of a stroke. Immediate treatment is necessary.   You or your child has an oral temperature above 102 F (38.9 C), not controlled by medicine.   You or your child has other signs of infection (chills, lethargy, irritability, poor eating, vomiting). The younger the child, the more you should be concerned.   With fevers, do not give medicine to lower the fever right away. This could cover up a problem that is developing. Notify your caregiver.   You or your child develops pain that is not helped with medicine.   You or your child develops shortness of breath or is coughing up pus-like or bloody sputum.   You or your child develops any problems that are new and are causing you to worry.   You or   your child develops a persistent, often uncomfortable and painful penile erection. This is called priapism. Always check young boys for this. It is often embarrassing for them and they may not bring it to your attention. This is a medical emergency and needs immediate treatment. If this is not treated it will lead to impotence.   You or your child develops a new onset of abdominal pain, especially on the left side near the  stomach area.   You or your child has any questions or has problems that are not getting better. Return immediately if you feel your child is getting worse, even if your child was seen only a short while ago.  Document Released: 07/16/2005 Document Revised: 09/25/2011 Document Reviewed: 12/05/2009 [employer] Patient Information 2012 [employer]. 

## 2012-05-14 NOTE — Discharge Summary (Signed)
Family Medicine Teaching Service  Discharge Note : Attending Travia Onstad MD Pager [phone] Office [phone] I have seen and examined this patient, reviewed their chart and discussed discharge planning wit the resident at the time of discharge. I agree with the discharge plan as above.  

## 2012-05-14 NOTE — Progress Notes (Signed)
FMTS Attending Daily Note: Justin Masullo MD [phone] pager office [phone] I  have seen and examined this patient, reviewed their chart. I have discussed this patient with the resident. I agree with the resident's findings, assessment and care plan. 

## 2012-05-18 IMAGING — US IR FLUORO GUIDE CV LINE*R*
1 series · 1 of 1 positions shown · non-contrast
Comparison: none

CLINICAL DATA: Sickle cell crisis; central venous access is
requested for fluids and medications.

[Series 1: ir fluoro guide cv line*right* · 1 of 1 slices shown]
[im 1/1]
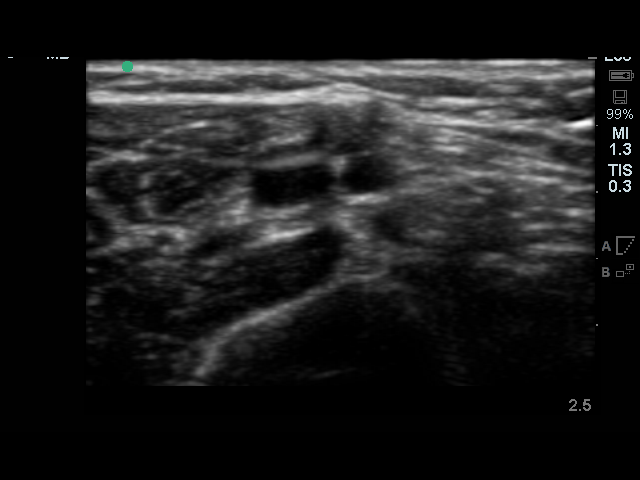

[1 of 1 positions shown; findings below may reference images not displayed]

PICC LINE PLACEMENT WITH ULTRASOUND AND FLUOROSCOPIC  GUIDANCE

Fluoroscopy Time: 0.9 minutes.

The right arm was prepped with chlorhexidine, draped in the usual
sterile fashion using maximum barrier technique (cap and mask,
sterile gown, sterile gloves, large sterile sheet, hand hygiene and
cutaneous antisepsis) and infiltrated locally with 1% Lidocaine.

Ultrasound demonstrated patency of the right brachial vein, and
this was documented with an image.  Under real-time ultrasound
guidance, this vein was accessed with a 21 gauge micropuncture
needle and image documentation was performed.  The needle was
exchanged over a guidewire for a peel-away sheath through which a 5
French double lumen PICC trimmed to 39 cm was advanced, positioned
with its tip at the lower SVC/right atrial junction.  Fluoroscopy
during the procedure and fluoro spot radiograph confirms
appropriate catheter position.  The catheter was flushed, secured
to the skin with Prolene sutures, and covered with a sterile
dressing.

Complications:  none
IMPRESSION: Successful right arm PICC line placement with ultrasound and
fluoroscopic guidance.  The catheter is ready for use.

Read by: Yeba, Diouf.-NOPASIKA

## 2012-05-19 IMAGING — CR DG CHEST 2V
2 series · 2 of 2 positions shown · non-contrast
Comparison: Chest radiograph 12/28/2011

CLINICAL DATA: Baseline for admission, history sickle cell disease

CHEST - 2 VIEW

[w chest pa]
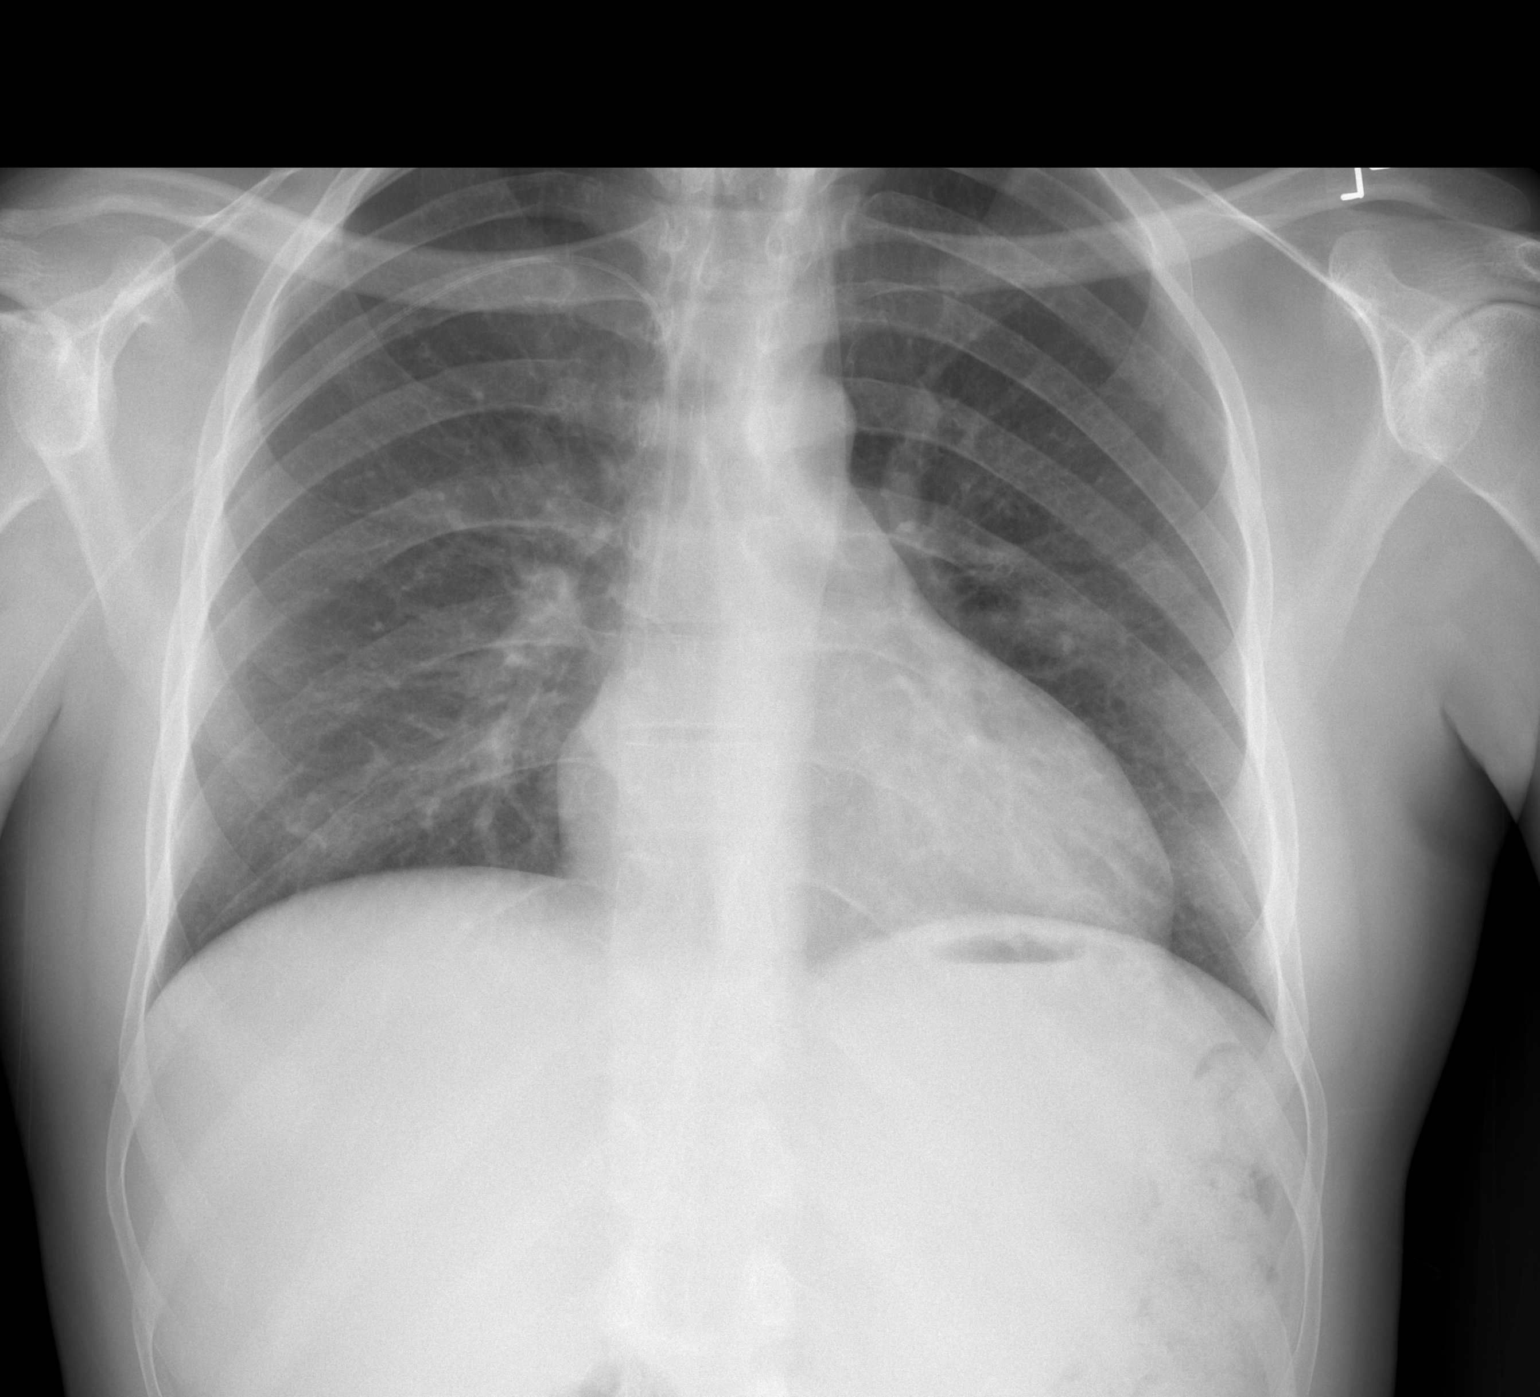

[w chest lat]
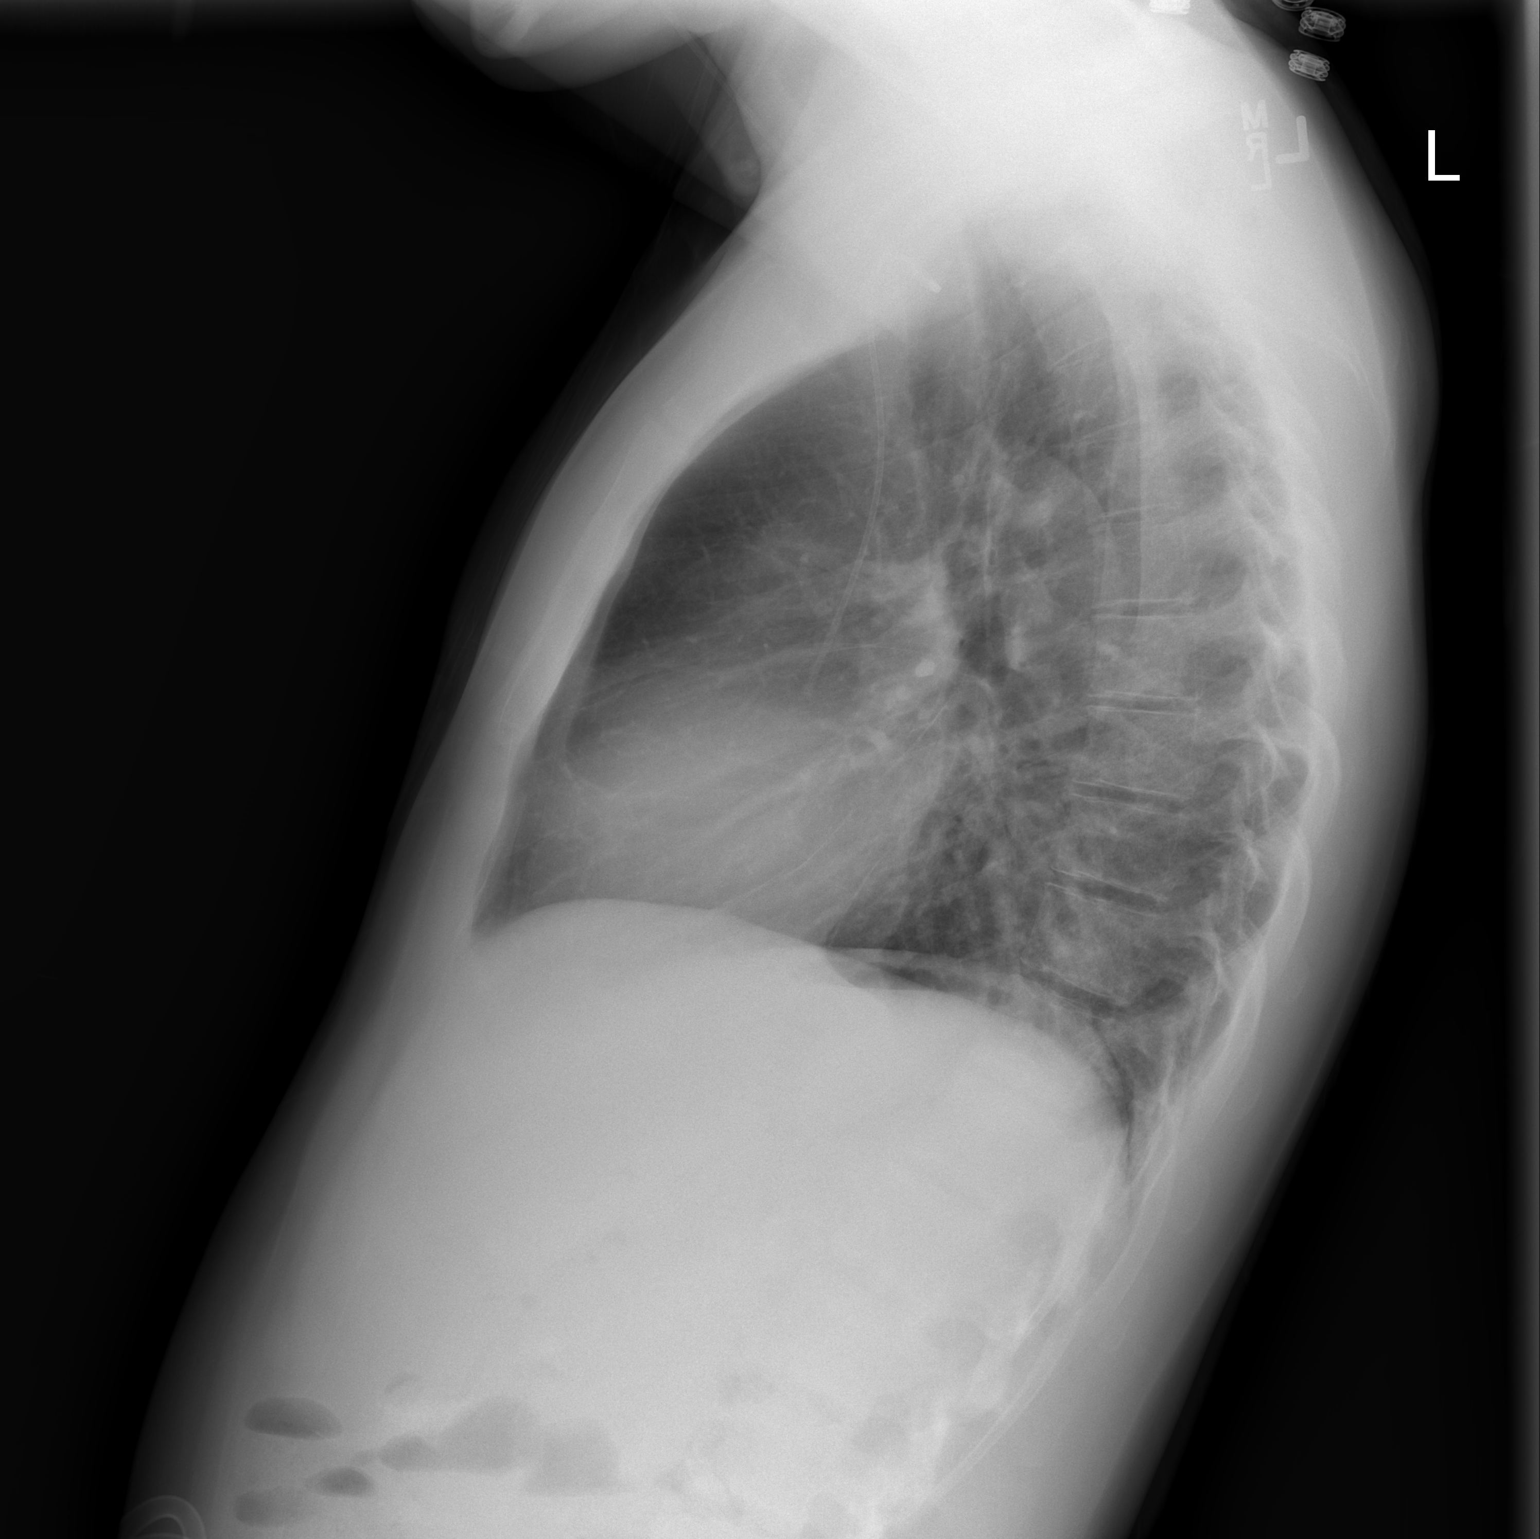

[2 of 2 positions shown; findings below may reference images not displayed]

FINDINGS: The heart silhouette is mildly enlarged.  No effusion,
infiltrate, or pneumothorax.  Right PICC line with tip in distal
SVC
IMPRESSION: Borderline cardiomegaly.  No infiltrate or infarction.]

## 2012-11-10 ENCOUNTER — Telehealth (HOSPITAL_COMMUNITY): Payer: Self-pay | Admitting: Hematology

## 2013-03-03 NOTE — Telephone Encounter (Signed)
See above note

## 2013-03-18 NOTE — Telephone Encounter (Signed)
05/11/2012 10:30 PM Phone (Outgoing) Minerva Areola, RN in ED (Other) [phone]  Completed - This RN received previous call ?? if bed available for sickle cell patient. This RN called ED spoke with Minerva Areola, RN to advise patient needs to be seen at [facility] based on previous note & it is up to hospitalist to admit the patient.   By Jama Flavors, RN  05/11/2012 10:40 PM Phone (Outgoing) Quinn Plowman in ED (Other) [phone]  Completed - Spoke with LCSW to advise previous plan set up for patient. Advised LCSW to review note regarding patient being seen at [facility].    By Jama Flavors, RN

## 2013-03-31 ENCOUNTER — Encounter (HOSPITAL_COMMUNITY): Payer: Self-pay

## 2013-03-31 ENCOUNTER — Emergency Department (HOSPITAL_COMMUNITY)
Admission: EM | Admit: 2013-03-31 | Discharge: 2013-04-01 | Disposition: A | Discharge status: Home / Self Care | Payer: Self-pay | Attending: Emergency Medicine | Admitting: Emergency Medicine

## 2013-03-31 ENCOUNTER — Emergency Department (HOSPITAL_COMMUNITY): Payer: Self-pay

## 2013-03-31 DIAGNOSIS — D72829 Elevated white blood cell count, unspecified: Secondary | ICD-10-CM | POA: Insufficient documentation

## 2013-03-31 DIAGNOSIS — D5700 Hb-SS disease with crisis, unspecified: Secondary | ICD-10-CM | POA: Insufficient documentation

## 2013-03-31 DIAGNOSIS — Z8673 Personal history of transient ischemic attack (TIA), and cerebral infarction without residual deficits: Secondary | ICD-10-CM | POA: Insufficient documentation

## 2013-03-31 DIAGNOSIS — R0789 Other chest pain: Secondary | ICD-10-CM | POA: Insufficient documentation

## 2013-03-31 DIAGNOSIS — Z79899 Other long term (current) drug therapy: Secondary | ICD-10-CM | POA: Insufficient documentation

## 2013-03-31 LAB — COMPREHENSIVE METABOLIC PANEL
ALT: 52 U/L (ref 0–53)
AST: 49 U/L — ABNORMAL HIGH (ref 0–37)
Alkaline Phosphatase: 171 U/L — ABNORMAL HIGH (ref 39–117)
CO2: 26 mEq/L (ref 19–32)
GFR calc Af Amer: >90 mL/min (ref >90)
Glucose, Bld: 62 mg/dL — ABNORMAL LOW (ref 70–99)
Potassium: 3.4 mEq/L — ABNORMAL LOW (ref 3.5–5.1)
Sodium: 139 mEq/L (ref 135–145)
Total Protein: 9.4 g/dL — ABNORMAL HIGH (ref 6.0–8.3)

## 2013-03-31 LAB — CBC
Hemoglobin: 8.5 g/dL — ABNORMAL LOW (ref 13.0–17.0)
RBC: 2.74 MIL/uL — ABNORMAL LOW (ref 4.22–5.81)

## 2013-03-31 LAB — POCT I-STAT TROPONIN I: Troponin i, poc: 0.03 ng/mL (ref 0.00–0.08)

## 2013-03-31 MED ORDER — HYDROMORPHONE HCL PF 2 MG/ML IJ SOLN
2.0000 mg | Freq: Once | INTRAMUSCULAR | Status: AC
  Administered 2013-03-31: 2 mg via INTRAVENOUS
  Filled 2013-03-31: qty 1

## 2013-03-31 MED ORDER — SODIUM CHLORIDE 0.9 % IV BOLUS (SEPSIS)
1000.0000 mL | Freq: Once | INTRAVENOUS | Status: AC
  Administered 2013-03-31: 1000 mL via INTRAVENOUS

## 2013-03-31 NOTE — ED Notes (Signed)
Pt complains of sickle cell pain in the left side of his chest since Sunday night

## 2013-03-31 NOTE — ED Notes (Signed)
Phlebotomy aware of need to draw labs, unable to obtain with IV

## 2013-03-31 NOTE — ED Provider Notes (Signed)
History     CSN: [id number]  Arrival date & time 03/31/13  1831   First MD Initiated Contact with Patient 03/31/13 2153      Chief Complaint  Patient presents with  . Sickle Cell Pain Crisis    (Consider location/radiation/quality/duration/timing/severity/associated sxs/prior treatment) HPI Comments: 84 six-year[age]old male the past medical history of sickle cell anemia presents to the emergency department complaining of left-sided chest pain x5 days beginning on Sunday night. Patient states this feels similar to his normal sickle cell pain. Pain described as sharp, constant, nonradiating rated 7/10. Initially pain relieved by Dilaudid by mouth, however over the past few days he has not had relief. Denies fever, chills, recent illness, nausea, vomiting, shortness of breath or cough.  Patient is a 27 y.o. male presenting with sickle cell pain. The history is provided by the patient.  Sickle Cell Pain Crisis Associated symptoms: chest pain   Associated symptoms: no cough, no fever, no nausea, no shortness of breath and no vomiting     Past Medical History  Diagnosis Date  . Sickle cell anemia   . Stroke     states cva in 2009    Past Surgical History  Procedure Laterality Date  . Appendectomy      Family History  Problem Relation Age of Onset  . Diabetes Other   . Coronary artery disease Other     History  Substance Use Topics  . Smoking status: Never Smoker   . Smokeless tobacco: Not on file  . Alcohol Use: No      Review of Systems  Constitutional: Negative for fever and chills.  Respiratory: Negative for cough and shortness of breath.   Cardiovascular: Positive for chest pain. Negative for palpitations.  Gastrointestinal: Negative for nausea and vomiting.  Musculoskeletal: Negative for myalgias, back pain and arthralgias.  All other systems reviewed and are negative.    Allergies  Sulfa antibiotics and Sulfa drugs cross reactors  Home Medications    Current Outpatient Rx  Name  Route  Sig  Dispense  Refill  . folic acid (FOLVITE) 1 MG tablet   Oral   Take 1 mg by mouth daily.         Marland Kitchen HYDROmorphone (DILAUDID) 8 MG tablet   Oral   Take 8 mg by mouth every 4 (four) hours as needed. For pain         . hydroxyurea (HYDREA) 500 MG capsule   Oral   Take 1,000 mg by mouth daily. May take with food to minimize GI side effects.          . Multiple Vitamin (MULTIVITAMIN WITH MINERALS) TABS   Oral   Take 1 tablet by mouth daily.         Marland Kitchen oxycodone (OXY-IR) 5 MG capsule   Oral   Take 2 capsules (10 mg total) by mouth every 4 (four) hours as needed. For pain   60 capsule   0   . Rivaroxaban (XARELTO) 20 MG TABS   Oral   Take 20 mg by mouth daily.           BP 110/75  Pulse 95  Temp(Src) 98.8 F (37.1 C) (Oral)  Resp 18  Ht 6\' 1"  (1.854 m)  Wt 145 lb (65.772 kg)  BMI 19.13 kg/m2  SpO2 100%  Physical Exam  Nursing note and vitals reviewed. Constitutional: He is oriented to person, place, and time. He appears well-developed and well-nourished. No distress.  HENT:  Head: Normocephalic  and atraumatic.  Mouth/Throat: Oropharynx is clear and moist.  Eyes: Conjunctivae and EOM are normal. Pupils are equal, round, and reactive to light.  Neck: Normal range of motion. Neck supple.  Cardiovascular: Normal rate, regular rhythm, normal heart sounds and intact distal pulses.  Exam reveals no gallop and no friction rub.   No murmur heard. Pulmonary/Chest: Effort normal and breath sounds normal. No respiratory distress. He has no wheezes. He has no rales. He exhibits no tenderness.  Abdominal: Soft. Bowel sounds are normal. There is no tenderness.  Musculoskeletal: Normal range of motion. He exhibits no edema.  Neurological: He is alert and oriented to person, place, and time.  Skin: Skin is warm and dry. He is not diaphoretic.  Psychiatric: He has a normal mood and affect. His behavior is normal.    ED Course   Procedures (including critical care time)  Labs Reviewed  CBC - Abnormal; Notable for the following:    WBC 12.8 (*)    RBC 2.74 (*)    Hemoglobin 8.5 (*)    HCT 25.2 (*)    RDW 17.5 (*)    Platelets 568 (*)    All other components within normal limits  COMPREHENSIVE METABOLIC PANEL - Abnormal; Notable for the following:    Potassium 3.4 (*)    Glucose, Bld 62 (*)    Total Protein 9.4 (*)    AST 49 (*)    Alkaline Phosphatase 171 (*)    All other components within normal limits  RETICULOCYTES - Abnormal; Notable for the following:    Retic Ct Pct 8.3 (*)    RBC. 2.74 (*)    Retic Count, Manual 227.4 (*)    All other components within normal limits  POCT I-STAT TROPONIN I   No results found.   1. Sickle cell anemia with pain   2. Sickle cell crisis       MDM  Sickle cell pain- retic count 8.3 and manual 227.4, markedly lower than prior results. Mild leukocytosis of 12.8. CXR unremarkable. Troponin negative. He is in NAD, texting on phone, chewing gum and eating crackers with peanut butter. Pain decreased to 5/10 after 3 doses of 2mg  dilaudid. States he feels as if he can go home and f/u with his PCP. Patient stable for discharge.        Trevor Mace, PA-C 04/01/13 [phone]

## 2013-04-01 MED ORDER — HYDROMORPHONE HCL PF 2 MG/ML IJ SOLN
2.0000 mg | Freq: Once | INTRAMUSCULAR | Status: AC
  Administered 2013-04-01: 2 mg via INTRAVENOUS
  Filled 2013-04-01: qty 1

## 2013-04-02 NOTE — ED Provider Notes (Signed)
Medical screening examination/treatment/procedure(s) were performed by non-physician practitioner and as supervising physician I was immediately available for consultation/collaboration.   Richardean Canal, MD 04/02/13 1501

## 2014-04-14 ENCOUNTER — Encounter (HOSPITAL_COMMUNITY): Payer: Self-pay | Admitting: Emergency Medicine

## 2014-04-14 ENCOUNTER — Emergency Department (HOSPITAL_COMMUNITY): Payer: Medicaid - Out of State

## 2014-04-14 ENCOUNTER — Inpatient Hospital Stay (HOSPITAL_COMMUNITY)
Admission: EM | Admit: 2014-04-14 | Discharge: 2014-04-21 | DRG: 811 | Disposition: A | Discharge status: Home / Self Care | Payer: Medicaid - Out of State | Attending: Internal Medicine | Admitting: Internal Medicine

## 2014-04-14 DIAGNOSIS — Z833 Family history of diabetes mellitus: Secondary | ICD-10-CM | POA: Diagnosis not present

## 2014-04-14 DIAGNOSIS — Z8249 Family history of ischemic heart disease and other diseases of the circulatory system: Secondary | ICD-10-CM | POA: Diagnosis not present

## 2014-04-14 DIAGNOSIS — R000 Tachycardia, unspecified: Secondary | ICD-10-CM | POA: Diagnosis present

## 2014-04-14 DIAGNOSIS — I871 Compression of vein: Secondary | ICD-10-CM | POA: Diagnosis present

## 2014-04-14 DIAGNOSIS — R5081 Fever presenting with conditions classified elsewhere: Secondary | ICD-10-CM | POA: Diagnosis present

## 2014-04-14 DIAGNOSIS — J189 Pneumonia, unspecified organism: Secondary | ICD-10-CM | POA: Diagnosis present

## 2014-04-14 DIAGNOSIS — Z86711 Personal history of pulmonary embolism: Secondary | ICD-10-CM

## 2014-04-14 DIAGNOSIS — Z8673 Personal history of transient ischemic attack (TIA), and cerebral infarction without residual deficits: Secondary | ICD-10-CM | POA: Diagnosis not present

## 2014-04-14 DIAGNOSIS — Z881 Allergy status to other antibiotic agents status: Secondary | ICD-10-CM

## 2014-04-14 DIAGNOSIS — R6510 Systemic inflammatory response syndrome (SIRS) of non-infectious origin without acute organ dysfunction: Secondary | ICD-10-CM | POA: Diagnosis present

## 2014-04-14 DIAGNOSIS — D473 Essential (hemorrhagic) thrombocythemia: Secondary | ICD-10-CM | POA: Diagnosis present

## 2014-04-14 DIAGNOSIS — Z86718 Personal history of other venous thrombosis and embolism: Secondary | ICD-10-CM | POA: Diagnosis not present

## 2014-04-14 DIAGNOSIS — Z7901 Long term (current) use of anticoagulants: Secondary | ICD-10-CM | POA: Diagnosis not present

## 2014-04-14 DIAGNOSIS — D5700 Hb-SS disease with crisis, unspecified: Secondary | ICD-10-CM | POA: Diagnosis present

## 2014-04-14 DIAGNOSIS — Z882 Allergy status to sulfonamides status: Secondary | ICD-10-CM | POA: Diagnosis not present

## 2014-04-14 DIAGNOSIS — D72829 Elevated white blood cell count, unspecified: Secondary | ICD-10-CM | POA: Diagnosis present

## 2014-04-14 DIAGNOSIS — D5701 Hb-SS disease with acute chest syndrome: Secondary | ICD-10-CM | POA: Diagnosis present

## 2014-04-14 DIAGNOSIS — Z79899 Other long term (current) drug therapy: Secondary | ICD-10-CM | POA: Diagnosis not present

## 2014-04-14 DIAGNOSIS — R0789 Other chest pain: Secondary | ICD-10-CM | POA: Diagnosis present

## 2014-04-14 DIAGNOSIS — R509 Fever, unspecified: Secondary | ICD-10-CM

## 2014-04-14 DIAGNOSIS — Z832 Family history of diseases of the blood and blood-forming organs and certain disorders involving the immune mechanism: Secondary | ICD-10-CM | POA: Diagnosis not present

## 2014-04-14 DIAGNOSIS — D571 Sickle-cell disease without crisis: Secondary | ICD-10-CM | POA: Diagnosis present

## 2014-04-14 DIAGNOSIS — R079 Chest pain, unspecified: Secondary | ICD-10-CM | POA: Diagnosis present

## 2014-04-14 HISTORY — DX: Other pulmonary embolism without acute cor pulmonale: I26.99

## 2014-04-14 HISTORY — DX: Acute ischemic heart disease, unspecified: I24.9

## 2014-04-14 LAB — CBC WITH DIFFERENTIAL/PLATELET
Basophils Absolute: 0 10*3/uL (ref 0.0–0.1)
Basophils Relative: 0 % (ref 0–1)
EOS PCT: 0 % (ref 0–5)
Eosinophils Absolute: 0 10*3/uL (ref 0.0–0.7)
HEMATOCRIT: 24.3 % — AB (ref 39.0–52.0)
Hemoglobin: 8.2 g/dL — ABNORMAL LOW (ref 13.0–17.0)
LYMPHS ABS: 1.2 10*3/uL (ref 0.7–4.0)
LYMPHS PCT: 10 % — AB (ref 12–46)
MCH: 32.4 pg (ref 26.0–34.0)
MCHC: 33.7 g/dL (ref 30.0–36.0)
MCV: 96 fL (ref 78.0–100.0)
Monocytes Absolute: 0.8 10*3/uL (ref 0.1–1.0)
Monocytes Relative: 7 % (ref 3–12)
NEUTROS ABS: 9.8 10*3/uL — AB (ref 1.7–7.7)
Neutrophils Relative %: 83 % — ABNORMAL HIGH (ref 43–77)
PLATELETS: 437 10*3/uL — AB (ref 150–400)
RBC: 2.53 MIL/uL — AB (ref 4.22–5.81)
RDW: 20.4 % — ABNORMAL HIGH (ref 11.5–15.5)
WBC: 11.8 10*3/uL — AB (ref 4.0–10.5)

## 2014-04-14 LAB — COMPREHENSIVE METABOLIC PANEL
ALK PHOS: 201 U/L — AB (ref 39–117)
ALT: 39 U/L (ref 0–53)
AST: 35 U/L (ref 0–37)
Albumin: 3.6 g/dL (ref 3.5–5.2)
BILIRUBIN TOTAL: 0.9 mg/dL (ref 0.3–1.2)
BUN: 8 mg/dL (ref 6–23)
CALCIUM: 9.3 mg/dL (ref 8.4–10.5)
CHLORIDE: 105 meq/L (ref 96–112)
CO2: 25 meq/L (ref 19–32)
Creatinine, Ser: 0.55 mg/dL (ref 0.50–1.35)
GLUCOSE: 76 mg/dL (ref 70–99)
Potassium: 3.9 mEq/L (ref 3.7–5.3)
SODIUM: 142 meq/L (ref 137–147)
Total Protein: 9.1 g/dL — ABNORMAL HIGH (ref 6.0–8.3)

## 2014-04-14 LAB — URINALYSIS, ROUTINE W REFLEX MICROSCOPIC
BILIRUBIN URINE: NEGATIVE
GLUCOSE, UA: NEGATIVE mg/dL
HGB URINE DIPSTICK: NEGATIVE
KETONES UR: NEGATIVE mg/dL
Leukocytes, UA: NEGATIVE
Nitrite: NEGATIVE
PH: 5.5 (ref 5.0–8.0)
PROTEIN: NEGATIVE mg/dL
Specific Gravity, Urine: 1.012 (ref 1.005–1.030)
Urobilinogen, UA: 0.2 mg/dL (ref 0.0–1.0)

## 2014-04-14 LAB — RETICULOCYTES
RBC.: 2.53 MIL/uL — ABNORMAL LOW (ref 4.22–5.81)
RETIC COUNT ABSOLUTE: 298.5 10*3/uL — AB (ref 19.0–186.0)
Retic Ct Pct: 11.8 % — ABNORMAL HIGH (ref 0.4–3.1)

## 2014-04-14 LAB — D-DIMER, QUANTITATIVE (NOT AT ARMC): D DIMER QUANT: 1.29 ug{FEU}/mL — AB (ref 0.00–0.48)

## 2014-04-14 LAB — TROPONIN I

## 2014-04-14 MED ORDER — FOLIC ACID 1 MG PO TABS
1.0000 mg | ORAL_TABLET | Freq: Every day | ORAL | Status: DC
  Administered 2014-04-14 – 2014-04-21 (×8): 1 mg via ORAL
  Filled 2014-04-14 (×8): qty 1

## 2014-04-14 MED ORDER — HYDROMORPHONE HCL PF 2 MG/ML IJ SOLN
2.0000 mg | Freq: Once | INTRAMUSCULAR | Status: AC
  Administered 2014-04-14: 2 mg via INTRAVENOUS
  Filled 2014-04-14: qty 1

## 2014-04-14 MED ORDER — KETOROLAC TROMETHAMINE 30 MG/ML IJ SOLN
30.0000 mg | Freq: Once | INTRAMUSCULAR | Status: AC
  Administered 2014-04-14: 30 mg via INTRAVENOUS
  Filled 2014-04-14: qty 1

## 2014-04-14 MED ORDER — SODIUM CHLORIDE 0.9 % IV SOLN
INTRAVENOUS | Status: DC
  Administered 2014-04-14 (×2): via INTRAVENOUS
  Administered 2014-04-16: 150 mL/h via INTRAVENOUS
  Administered 2014-04-16: via INTRAVENOUS

## 2014-04-14 MED ORDER — RIVAROXABAN 20 MG PO TABS
20.0000 mg | ORAL_TABLET | Freq: Every day | ORAL | Status: DC
  Administered 2014-04-14 – 2014-04-21 (×8): 20 mg via ORAL
  Filled 2014-04-14 (×8): qty 1

## 2014-04-14 MED ORDER — NALOXONE HCL 0.4 MG/ML IJ SOLN: 0.4000 mg | INTRAMUSCULAR | Status: DC | PRN

## 2014-04-14 MED ORDER — TECHNETIUM TC 99M DIETHYLENETRIAME-PENTAACETIC ACID: 44.0000 | Freq: Once | INTRAVENOUS | Status: AC | PRN

## 2014-04-14 MED ORDER — DIPHENHYDRAMINE HCL 50 MG/ML IJ SOLN: 12.5000 mg | Freq: Four times a day (QID) | INTRAMUSCULAR | Status: DC | PRN

## 2014-04-14 MED ORDER — ONDANSETRON HCL 4 MG/2ML IJ SOLN: 4.0000 mg | Freq: Three times a day (TID) | INTRAMUSCULAR | Status: AC | PRN

## 2014-04-14 MED ORDER — DIPHENHYDRAMINE HCL 25 MG PO CAPS: 25.0000 mg | ORAL_CAPSULE | ORAL | Status: DC | PRN

## 2014-04-14 MED ORDER — ONDANSETRON HCL 4 MG/2ML IJ SOLN: 4.0000 mg | Freq: Four times a day (QID) | INTRAMUSCULAR | Status: DC | PRN

## 2014-04-14 MED ORDER — VANCOMYCIN HCL IN DEXTROSE 1-5 GM/200ML-% IV SOLN
1000.0000 mg | Freq: Three times a day (TID) | INTRAVENOUS | Status: DC
  Administered 2014-04-14 – 2014-04-15 (×3): 1000 mg via INTRAVENOUS
  Filled 2014-04-14 (×6): qty 200

## 2014-04-14 MED ORDER — HYDROXYUREA 500 MG PO CAPS
1000.0000 mg | ORAL_CAPSULE | Freq: Every day | ORAL | Status: DC
  Administered 2014-04-15 – 2014-04-21 (×7): 1000 mg via ORAL
  Filled 2014-04-14 (×9): qty 2

## 2014-04-14 MED ORDER — TECHNETIUM TO 99M ALBUMIN AGGREGATED
5.5000 | Freq: Once | INTRAVENOUS | Status: AC | PRN
  Administered 2014-04-14: 6 via INTRAVENOUS

## 2014-04-14 MED ORDER — HYDROMORPHONE 0.3 MG/ML IV SOLN: INTRAVENOUS | Status: DC

## 2014-04-14 MED ORDER — HYDROMORPHONE HCL PF 2 MG/ML IJ SOLN: 2.0000 mg | INTRAMUSCULAR | Status: DC | PRN

## 2014-04-14 MED ORDER — PROMETHAZINE HCL 25 MG PO TABS: 12.5000 mg | ORAL_TABLET | ORAL | Status: DC | PRN

## 2014-04-14 MED ORDER — PIPERACILLIN-TAZOBACTAM 3.375 G IVPB
3.3750 g | Freq: Once | INTRAVENOUS | Status: AC
  Administered 2014-04-14: 3.375 g via INTRAVENOUS
  Filled 2014-04-14 (×2): qty 50

## 2014-04-14 MED ORDER — PROMETHAZINE HCL 12.5 MG RE SUPP
12.5000 mg | RECTAL | Status: DC | PRN
  Filled 2014-04-14: qty 2

## 2014-04-14 MED ORDER — VANCOMYCIN HCL 10 G IV SOLR
1000.0000 mg | Freq: Once | INTRAVENOUS | Status: DC
  Filled 2014-04-14: qty 1000

## 2014-04-14 MED ORDER — SODIUM CHLORIDE 0.9 % IV SOLN
INTRAVENOUS | Status: DC
  Administered 2014-04-14: 10:00:00 via INTRAVENOUS

## 2014-04-14 MED ORDER — SODIUM CHLORIDE 0.9 % IV SOLN: INTRAVENOUS | Status: DC

## 2014-04-14 MED ORDER — DIPHENHYDRAMINE HCL 50 MG/ML IJ SOLN: 12.5000 mg | INTRAMUSCULAR | Status: DC | PRN

## 2014-04-14 MED ORDER — SODIUM CHLORIDE 0.9 % IV BOLUS (SEPSIS)
1000.0000 mL | Freq: Once | INTRAVENOUS | Status: AC
  Administered 2014-04-14: 1000 mL via INTRAVENOUS

## 2014-04-14 MED ORDER — DIPHENHYDRAMINE HCL 12.5 MG/5ML PO ELIX: 12.5000 mg | ORAL_SOLUTION | Freq: Four times a day (QID) | ORAL | Status: DC | PRN

## 2014-04-14 MED ORDER — PIPERACILLIN-TAZOBACTAM 3.375 G IVPB
3.3750 g | Freq: Three times a day (TID) | INTRAVENOUS | Status: DC
  Administered 2014-04-14 – 2014-04-16 (×5): 3.375 g via INTRAVENOUS
  Filled 2014-04-14 (×6): qty 50

## 2014-04-14 MED ORDER — SODIUM CHLORIDE 0.9 % IJ SOLN: 9.0000 mL | INTRAMUSCULAR | Status: DC | PRN

## 2014-04-14 MED ORDER — ACETAMINOPHEN 500 MG PO TABS: 1000.0000 mg | ORAL_TABLET | Freq: Four times a day (QID) | ORAL | Status: DC | PRN

## 2014-04-14 MED ORDER — HYDROMORPHONE HCL 4 MG PO TABS
8.0000 mg | ORAL_TABLET | ORAL | Status: DC | PRN
  Administered 2014-04-14 – 2014-04-15 (×5): 8 mg via ORAL
  Filled 2014-04-14 (×5): qty 2

## 2014-04-14 MED ORDER — VANCOMYCIN HCL IN DEXTROSE 1-5 GM/200ML-% IV SOLN
1000.0000 mg | Freq: Once | INTRAVENOUS | Status: AC
  Administered 2014-04-14: 1000 mg via INTRAVENOUS
  Filled 2014-04-14: qty 200

## 2014-04-14 MED ORDER — ONDANSETRON HCL 4 MG/2ML IJ SOLN
4.0000 mg | Freq: Once | INTRAMUSCULAR | Status: AC
  Administered 2014-04-14: 4 mg via INTRAVENOUS
  Filled 2014-04-14: qty 2

## 2014-04-14 NOTE — ED Notes (Signed)
Lab phlebotomist unable to draw blood.

## 2014-04-14 NOTE — ED Notes (Signed)
Pt ret from VQ scan at this time, asking for water, pt aware need to wait for results at this time.  Denies further needs/complaints.  NAD.

## 2014-04-14 NOTE — ED Notes (Signed)
Phlebotomy called to draw blood. rn with ultrasound only found 1 small vien. Tech drew blood and only got enough for blood cultures, and tech assessed again and did not see anywhere else to draw blood from.

## 2014-04-14 NOTE — Progress Notes (Signed)
ANTIBIOTIC CONSULT NOTE - INITIAL  Pharmacy Consult for Vancomycin & Zosyn  Indication: rule out PNA, hx of Caroline  Allergies  Allergen Reactions  . Sulfa Antibiotics Hives  . Sulfa Drugs Cross Reactors Hives   Patient Measurements: Weight: 150 lb (68.04 kg)  Vital Signs: Temp: 98.5 F (36.9 C) (06/26 1426) Temp src: Oral (06/26 1426) BP: 109/82 mmHg (06/26 1400) Pulse Rate: 91 (06/26 1100) Intake/Output from previous day:   Intake/Output from this shift: Total I/O In: 1000 [I.V.:1000] Out: -   Labs:  Recent Labs  04/14/14 0945  WBC 11.8*  HGB 8.2*  PLT 437*  CREATININE 0.55   The CrCl is unknown because both a height and weight (above a minimum accepted value) are required for this calculation. No results found for this basename: VANCOTROUGH, VANCOPEAK, VANCORANDOM, GENTTROUGH, GENTPEAK, GENTRANDOM, TOBRATROUGH, TOBRAPEAK, TOBRARND, AMIKACINPEAK, AMIKACINTROU, AMIKACIN,  in the last 72 hours   Microbiology: No results found for this or any previous visit (from the past 720 hour(s)).  Medical History: Past Medical History  Diagnosis Date  . Sickle cell anemia   . Stroke     states cva in 2009  . Acute coronary syndrome   . Pulmonary embolus    Medications:  Scheduled:  . folic acid  1 mg Oral Daily  . [START ON 04/15/2014] hydroxyurea  1,000 mg Oral Q breakfast  . rivaroxaban  20 mg Oral Daily   Anti-infectives   Start     Dose/Rate Route Frequency Ordered Stop   04/14/14 1445  vancomycin (VANCOCIN) IVPB 1000 mg/200 mL premix     1,000 mg 200 mL/hr over 60 Minutes Intravenous  Once 04/14/14 1436     04/14/14 1430  vancomycin (VANCOCIN) injection 1,000 mg  Status:  Discontinued     1,000 mg Intravenous  Once 04/14/14 1426 04/14/14 1436   04/14/14 1430  piperacillin-tazobactam (ZOSYN) IVPB 3.375 g     3.375 g 100 mL/hr over 30 Minutes Intravenous  Once 04/14/14 1426 04/14/14 1511     Assessment: 55 yoM with c/o fever to 102, chest pain. Hx of Parmele anemia  and PE, currently on Xarelto. Patient refused CT scan, underwent VQ scan-negative for PE. WBC 11.8K, afebrile now.  Vancomycin 1gm and Zosyn 3.375gm x1 in ED, then per pharmacy  CXR no effusion or acute process, rule out acute chest syndrome  Goal of Therapy:  Vancomycin trough level 15-20 mcg/ml  Plan:   Vancomycin 1gm q8hr  Zosyn 3.375gm q8hr - 4 hr infusion  De-escalate abx when able  Minda Ditto PharmD Pager [phone] 04/14/2014, 3:21 PM

## 2014-04-14 NOTE — ED Notes (Signed)
Phlebotomy at bedside.

## 2014-04-14 NOTE — Progress Notes (Signed)
After informed consent obtained, Visualized veins in bilateral upper arms for PICC placement. No veins large enough for PICC placement. Primary RN Vanita Ingles notified.

## 2014-04-14 NOTE — ED Notes (Signed)
Off floor for testing 

## 2014-04-14 NOTE — ED Notes (Signed)
Pt given a urinal.

## 2014-04-14 NOTE — ED Notes (Signed)
Attempted to call report to floor, RN unavailable at this time.  

## 2014-04-14 NOTE — ED Notes (Signed)
Gave pt sandwich and sprite per MD.

## 2014-04-14 NOTE — ED Notes (Signed)
Pt encouraged to provide a urine sample. Pt will attempt again.   Pt is suppose to stay NPO until after scan and then can have fluids.

## 2014-04-14 NOTE — H&P (Signed)
[facility] History and Physical  Wah Sabic [id number] DOB: 1986-10-14 DOA: 04/14/2014  Referring physician: ER physician PCP: Jordan Likes, MD   Chief Complaint: chest pain  HPI:  28 year old very pleasant young male with past medical history of sickle cell disease (SS) who presented to [facility] ED 04/14/2014 with worsening mid to left sternal chest pain for past few days prior to this admission, 10/10 in intensity, radiating to his back and worse with deep breathing. Patient reported pain did not get better with rest or with dilaudid he takes at home. No significant joint pains reported. No reports of fatigue or weakness. No shortness of breath. He does report having cough but no fevers. In ED, he reported however he did not have cough but had some shortness of breath. No abdominal pain, nausea or vomiting. No lightheadedness or loss of consciousness. In ED, his BP was 109/82, HR 91-112, RR 14-19, oxygen saturation 99% on room air. CXR did not show acute cardiopulmonary process. V/Q scan was negative for pulmonary embolism. V/Q scan done per pt request as he did not want CT angio chest). ED medications: NS 1 L; dilaudid 2 mg IV x 3 doses; Zofran 4 mg IV x 1 dose.  Assessment & Plan    Principal Problem:   Sickle cell crisis, acute - Pain management: restart patient's home medication regimen which includes dilaudid 8 mg every 4 hours PO PRN. Added dilaudid PCA. - Adjuvant pain therapy: None.  - Wean IV narcotics when pain rated 7/10.  - When able to tolerate oral therapy, convert at 50-75% of IV dose & give PRNs Q 2-3 hours.  - Monitor CBC (add differential if on Hydrea) Q 1-2 day(s).  - Monitor bilirubin/LDH Q 72 hours to monitor hemolysis.  - Last reticulocyte count was on admission 11.8 - Continue Hydrea.  - Continue IV fluids - K pad PRN.  - Continue folic acid.   Active Problems:   CAP (community acquired pneumonia) / SIRS / Leukocytosis - meets SIRS criteria with  tachycardia, leukocytosis. He may have possible pneumonia considering he has had cough. NO fevers however and no evidence of pneumonia on CXR. Will narrow down abx as soon as blood cultures results are back.   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     Nm Pulmonary Perf And Vent 04/14/2014    IMPRESSION: Negative nuclear medicine ventilation perfusion lung scan for pulmonary embolus.      EKG: sinus tachycardia   Code Status: Full Family Communication: Plan of care discussed with the patient  Disposition Plan: Admit for further evaluation  Leisa Lenz, MD  [facility] Pager [phone]  Review of Systems:  Constitutional: Negative for fever, chills and malaise/fatigue. Negative for diaphoresis.  HENT: Negative for hearing loss, ear pain, nosebleeds, congestion, sore throat, neck pain, tinnitus and ear discharge.   Eyes: Negative for blurred vision, double vision, photophobia, pain, discharge and redness.  Respiratory: Negative for cough, hemoptysis, sputum production, shortness of breath, wheezing and stridor.   Cardiovascular: per HPI.  Gastrointestinal: Negative for nausea, vomiting and abdominal pain. Negative for heartburn, constipation,  blood in stool and melena.  Genitourinary: Negative for dysuria, urgency, frequency, hematuria and flank pain.  Musculoskeletal: Negative for myalgias, back pain, joint pain and falls.  Skin: Negative for itching and rash.  Neurological: Negative for dizziness and weakness. Negative for tingling, tremors, sensory change, speech change, focal weakness, loss of consciousness and headaches.   Endo/Heme/Allergies: Negative for environmental allergies and polydipsia. Does not bruise/bleed easily.  Psychiatric/Behavioral: Negative for suicidal ideas. The patient is not nervous/anxious.      Past Medical History  Diagnosis Date  . Sickle cell anemia   . Stroke     states cva in 2009  . Acute coronary syndrome   . Pulmonary embolus    Past Surgical History  Procedure Laterality Date  . Appendectomy     Social History:  reports that he has never smoked. He has never used smokeless tobacco. He reports that he does not drink alcohol or use illicit drugs.  Allergies  Allergen Reactions  . Sulfa Antibiotics Hives  . Sulfa Drugs Cross Reactors Hives    Family History:  Family History  Problem Relation Age of Onset  . Diabetes Other    HTN Mother     Sickle cell trait  Parents   . Coronary artery disease Other      Prior to Admission medications   Medication Sig Start Date End Date Taking? Authorizing Provider  acetaminophen (TYLENOL) 500 MG tablet Take 1,000 mg by mouth every 6 (six) hours as needed for fever.   Yes Historical Provider, MD  folic acid (FOLVITE) 1 MG tablet Take 1 mg by mouth daily.   Yes Historical Provider, MD  HYDROmorphone (DILAUDID) 8 MG tablet Take 8 mg by mouth every 4 (four) hours as needed for moderate pain. For pain   Yes Historical Provider, MD  hydroxyurea (HYDREA) 500 MG capsule Take 1,000 mg by mouth daily. May take with food to minimize GI side effects.   Yes Historical Provider, MD  Rivaroxaban (XARELTO) 20 MG TABS Take 20 mg by mouth daily.   Yes Historical Provider, MD   Physical Exam: Filed Vitals:   04/14/14 1426 04/14/14 1500 04/14/14 1530 04/14/14 1604  BP:   116/65 114/71  Pulse:    94  Temp: 98.5 F (36.9 C)   98.2 F (36.8 C)  TempSrc: Oral   Oral  Resp:   19 18  Height:    6\' 1"  (1.854 m)  Weight:  68.04 kg (150 lb)  70 kg (154 lb 5.2 oz)  SpO2:    99%    Physical Exam  Constitutional: Appears well-developed and  well-nourished. No distress.  HENT: Normocephalic. No tonsillar erythema or exudates Eyes: Conjunctivae and EOM are normal. PERRLA, no scleral icterus.  Neck: Normal ROM. Neck supple. No JVD. No tracheal deviation. No thyromegaly.  CVS: tachycardia, S1/S2 +, no murmurs, no gallops, no carotid bruit.  Pulmonary: Effort and breath sounds normal, no stridor, rhonchi, wheezes, rales.  Abdominal: Soft. BS +,  no distension, tenderness, rebound or guarding.  Musculoskeletal: Normal range of motion. No edema and no tenderness.  Lymphadenopathy: No lymphadenopathy noted, cervical, inguinal. Neuro: Alert. Normal reflexes, muscle tone coordination. No focal neurologic deficits. Skin: Skin is warm and dry. No rash noted. Not diaphoretic. No erythema. No pallor.  Psychiatric: Normal mood and affect. Behavior, judgment, thought content normal.   Labs on Admission:  Basic Metabolic Panel:  Recent Labs Lab 04/14/14 0945  NA 142  K 3.9  CL 105  CO2 25  GLUCOSE 76  BUN 8  CREATININE 0.55  CALCIUM 9.3   Liver Function Tests:  Recent Labs Lab 04/14/14 0945  AST 35  ALT 39  ALKPHOS 201*  BILITOT 0.9  PROT 9.1*  ALBUMIN 3.6   No results found for this basename: LIPASE, AMYLASE,  in the last 168 hours No results found for this basename: AMMONIA,  in the last 168 hours CBC:  Recent Labs Lab 04/14/14 0945  WBC 11.8*  NEUTROABS 9.8*  HGB 8.2*  HCT 24.3*  MCV 96.0  PLT 437*   Cardiac Enzymes:  Recent Labs Lab 04/14/14 0935  TROPONINI <0.30   BNP: No components found with this basename: POCBNP,  CBG: No results found for this basename: GLUCAP,  in the last 168 hours  If 7PM-7AM, please contact night-coverage [email or web address] Password [email or web address] 04/14/2014, 4:16 PM

## 2014-04-14 NOTE — ED Notes (Signed)
Pt to nuc med

## 2014-04-14 NOTE — Progress Notes (Signed)
This shift pt inquiring about pain management and the fact that a PCA pump was discussed with ED doctor.  Confirmed with pharmacy that PCA order has been discontinued. Will advise pt.

## 2014-04-14 NOTE — ED Notes (Signed)
Initial Contact - pt A+Ox4, lab phlebotomist at bedside attempting to draw blood.   Pt c/o chest pain, SCC, x3 days.  Skin PWD.  MAEI.  Pt on cardiac/02 monitor.  NAD.

## 2014-04-14 NOTE — Progress Notes (Signed)
This shift received notification from IV RN that PICC attempt was unsuccessful. hospitalist M. Lynch notified and  Order placed for IR placement

## 2014-04-14 NOTE — ED Provider Notes (Signed)
CSN: [id number]     Arrival date & time 04/14/14  0906 History   First MD Initiated Contact with Patient 04/14/14 [phone]     Chief Complaint  Patient presents with  . Fever  . Chest Pain     (Consider location/radiation/quality/duration/timing/severity/associated sxs/prior Treatment) HPI  Patient has a history of sickle cell disease. He states he has a crisis about every 2-3 months. He states he started having pain about 6 days ago. He indicates the pain is in his left lateral chest. The pain has been constant. It rarely radiates into his back. He states it feels sharp and the pain feels like it occurs with his heart beating. He states it gets worse with deep breathing. He states the pain started getting worse about 3 days ago. Last night he started having fevers and at 2 AM it was 102. He started taking ibuprofen for the fever. He states he has had chills. He denies cough but states he has some dyspnea on exertion but he has to walk a longer distance such as over 10 feet before he gets short of breath. He denies any positional component to the pain such as getting worse or better with lying or sitting. He denies any pain or swelling in his legs. He does have some minor pain in his hips. He states he feels weak and tired. He states he normally gets transfused once his hemoglobin drops below 7.5.  Patient reports he had a PE in September 2014. He reports he had to be intubated at that time. He states he had severe dyspnea and pain worse than today. He was started on xarelto at that time. He states he had a followup CT scan about 2 months ago that showed all the clots were gone. He states he has been compliant with his xarelto.  PCP Dr Dene Gentry  Past Medical History  Diagnosis Date  . Sickle cell anemia   . Stroke     states cva in 2009  . Acute coronary syndrome   . Pulmonary embolus    Past Surgical History  Procedure Laterality Date  . Appendectomy     Family History  Problem Relation  Age of Onset  . Diabetes Other   . Coronary artery disease Other    History  Substance Use Topics  . Smoking status: Never Smoker   . Smokeless tobacco: Not on file  . Alcohol Use: No  college student, masters  Review of Systems  All other systems reviewed and are negative.     Allergies  Sulfa antibiotics and Sulfa drugs cross reactors  Home Medications   Prior to Admission medications   Medication Sig Start Date End Date Taking? Authorizing Provider  folic acid (FOLVITE) 1 MG tablet Take 1 mg by mouth daily.    Historical Provider, MD  HYDROmorphone (DILAUDID) 8 MG tablet Take 8 mg by mouth every 4 (four) hours as needed. For pain    Historical Provider, MD  hydroxyurea (HYDREA) 500 MG capsule Take 1,000 mg by mouth daily. May take with food to minimize GI side effects.     Historical Provider, MD  Multiple Vitamin (MULTIVITAMIN WITH MINERALS) TABS Take 1 tablet by mouth daily.    Historical Provider, MD  oxycodone (OXY-IR) 5 MG capsule Take 2 capsules (10 mg total) by mouth every 4 (four) hours as needed. For pain 01/20/12   Vivien Rota, NP  Rivaroxaban (XARELTO) 20 MG TABS Take 20 mg by mouth daily.    Historical  Provider, MD   BP 110/77  Pulse 112  Temp(Src) 100 F (37.8 C) (Oral)  Resp 14  SpO2 100%  Vital signs normal except for tachycardia, low grade fever  Physical Exam  Nursing note and vitals reviewed. Constitutional: He is oriented to person, place, and time. He appears well-developed and well-nourished.  Non-toxic appearance. He does not appear ill. No distress.  HENT:  Head: Normocephalic and atraumatic.  Right Ear: External ear normal.  Left Ear: External ear normal.  Nose: Nose normal. No mucosal edema or rhinorrhea.  Mouth/Throat: Oropharynx is clear and moist and mucous membranes are normal. No dental abscesses or uvula swelling.  Eyes: Conjunctivae and EOM are normal. Pupils are equal, round, and reactive to light.  Neck: Normal range of  motion and full passive range of motion without pain. Neck supple.  Cardiovascular: Normal rate, regular rhythm and normal heart sounds.  Exam reveals no gallop and no friction rub.   No murmur heard. Pulmonary/Chest: Effort normal and breath sounds normal. No respiratory distress. He has no wheezes. He has no rhonchi. He has no rales. He exhibits no tenderness and no crepitus.    Area of pain  Abdominal: Soft. Normal appearance and bowel sounds are normal. He exhibits no distension. There is no tenderness. There is no rebound and no guarding.  Musculoskeletal: Normal range of motion. He exhibits no edema and no tenderness.  Moves all extremities well.   Neurological: He is alert and oriented to person, place, and time. He has normal strength. No cranial nerve deficit.  Skin: Skin is warm, dry and intact. No rash noted. No erythema. There is pallor.  Psychiatric: He has a normal mood and affect. His speech is normal and behavior is normal. His mood appears not anxious.    ED Course  Procedures (including critical care time)  Medications  technetium TC 70M diethylenetriame-pentaacetic acid (DTPA) injection 44 milli Curie (not administered)  piperacillin-tazobactam (ZOSYN) IVPB 3.375 g (3.375 g Intravenous New Bag/Given 04/14/14 1441)  vancomycin (VANCOCIN) IVPB 1000 mg/200 mL premix (not administered)  0.9 %  sodium chloride infusion ( Intravenous New Bag/Given 04/14/14 1441)  ondansetron (ZOFRAN) injection 4 mg (not administered)  0.9 %  sodium chloride infusion (not administered)  sodium chloride 0.9 % bolus 1,000 mL (0 mLs Intravenous Stopped 04/14/14 1121)  HYDROmorphone (DILAUDID) injection 2 mg (2 mg Intravenous Given 04/14/14 1001)  ondansetron (ZOFRAN) injection 4 mg (4 mg Intravenous Given 04/14/14 1001)  HYDROmorphone (DILAUDID) injection 2 mg (2 mg Intravenous Given 04/14/14 1119)  technetium albumin aggregated (MAA) injection solution 6 milli Curie (6 milli Curies Intravenous  Contrast Given 04/14/14 1336)  HYDROmorphone (DILAUDID) injection 2 mg (2 mg Intravenous Given 04/14/14 1441)  ketorolac (TORADOL) 30 MG/ML injection 30 mg (30 mg Intravenous Given 04/14/14 1441)   Patient rechecked at 10:55 AM. Patient had requested he have a nuclear medicine scan instead of a CT scan to evaluate him for another PE. He states his pain is down from a 10 to an 8. Patient is calmly watching CT scan in TV.  His nuclear medicine scan was ordered. He was given another dose of pain medication.  14:30 Recheck after his nuclear medicine scan. Patient states he still having pain. He was given more dilaudid  for pain. We discussed his negative scan result. Patient was started on IV antibiotics. He states his last admission was in April. He was started on healthcare associated pneumonia antibiotics although a definite diagnosis of pneumonia has not been made.  He is agreeable to be admitted to the hospital.  14:39 Dr Charlies Silvers, admit to tele, team 8   Labs Review  Results for orders placed during the hospital encounter of 04/14/14  CBC WITH DIFFERENTIAL      Result Value Ref Range   WBC 11.8 (*) 4.0 - 10.5 K/uL   RBC 2.53 (*) 4.22 - 5.81 MIL/uL   Hemoglobin 8.2 (*) 13.0 - 17.0 g/dL   HCT 24.3 (*) 39.0 - 52.0 %   MCV 96.0  78.0 - 100.0 fL   MCH 32.4  26.0 - 34.0 pg   MCHC 33.7  30.0 - 36.0 g/dL   RDW 20.4 (*) 11.5 - 15.5 %   Platelets 437 (*) 150 - 400 K/uL   Neutrophils Relative % 83 (*) 43 - 77 %   Neutro Abs 9.8 (*) 1.7 - 7.7 K/uL   Lymphocytes Relative 10 (*) 12 - 46 %   Lymphs Abs 1.2  0.7 - 4.0 K/uL   Monocytes Relative 7  3 - 12 %   Monocytes Absolute 0.8  0.1 - 1.0 K/uL   Eosinophils Relative 0  0 - 5 %   Eosinophils Absolute 0.0  0.0 - 0.7 K/uL   Basophils Relative 0  0 - 1 %   Basophils Absolute 0.0  0.0 - 0.1 K/uL  COMPREHENSIVE METABOLIC PANEL      Result Value Ref Range   Sodium 142  137 - 147 mEq/L   Potassium 3.9  3.7 - 5.3 mEq/L   Chloride 105  96 - 112 mEq/L    CO2 25  19 - 32 mEq/L   Glucose, Bld 76  70 - 99 mg/dL   BUN 8  6 - 23 mg/dL   Creatinine, Ser 0.55  0.50 - 1.35 mg/dL   Calcium 9.3  8.4 - 10.5 mg/dL   Total Protein 9.1 (*) 6.0 - 8.3 g/dL   Albumin 3.6  3.5 - 5.2 g/dL   AST 35  0 - 37 U/L   ALT 39  0 - 53 U/L   Alkaline Phosphatase 201 (*) 39 - 117 U/L   Total Bilirubin 0.9  0.3 - 1.2 mg/dL   GFR calc non Af Amer >90  >90 mL/min   GFR calc Af Amer >90  >90 mL/min  RETICULOCYTES      Result Value Ref Range   Retic Ct Pct 11.8 (*) 0.4 - 3.1 %   RBC. 2.53 (*) 4.22 - 5.81 MIL/uL   Retic Count, Manual 298.5 (*) 19.0 - 186.0 K/uL  URINALYSIS, ROUTINE W REFLEX MICROSCOPIC      Result Value Ref Range   Color, Urine YELLOW  YELLOW   APPearance CLEAR  CLEAR   Specific Gravity, Urine 1.012  1.005 - 1.030   pH 5.5  5.0 - 8.0   Glucose, UA NEGATIVE  NEGATIVE mg/dL   Hgb urine dipstick NEGATIVE  NEGATIVE   Bilirubin Urine NEGATIVE  NEGATIVE   Ketones, ur NEGATIVE  NEGATIVE mg/dL   Protein, ur NEGATIVE  NEGATIVE mg/dL   Urobilinogen, UA 0.2  0.0 - 1.0 mg/dL   Nitrite NEGATIVE  NEGATIVE   Leukocytes, UA NEGATIVE  NEGATIVE  TROPONIN I      Result Value Ref Range   Troponin I <0.30  <0.30 ng/mL  D-DIMER, QUANTITATIVE      Result Value Ref Range   D-Dimer, Quant 1.29 (*) 0.00 - 0.48 ug/mL-FEU           Results for orders placed  during the hospital encounter of 03/31/13  CBC      Result Value Ref Range   WBC 12.8 (*) 4.0 - 10.5 K/uL   RBC 2.74 (*) 4.22 - 5.81 MIL/uL   Hemoglobin 8.5 (*) 13.0 - 17.0 g/dL   HCT 25.2 (*) 39.0 - 52.0 %   MCV 92.0  78.0 - 100.0 fL   MCH 31.0  26.0 - 34.0 pg   MCHC 33.7  30.0 - 36.0 g/dL   RDW 17.5 (*) 11.5 - 15.5 %   Platelets 568 (*) 150 - 400 K/uL  COMPREHENSIVE METABOLIC PANEL      Result Value Ref Range   Sodium 139  135 - 145 mEq/L   Potassium 3.4 (*) 3.5 - 5.1 mEq/L   Chloride 101  96 - 112 mEq/L   CO2 26  19 - 32 mEq/L   Glucose, Bld 62 (*) 70 - 99 mg/dL   BUN 10  6 - 23 mg/dL    Creatinine, Ser 0.56  0.50 - 1.35 mg/dL   Calcium 9.4  8.4 - 10.5 mg/dL   Total Protein 9.4 (*) 6.0 - 8.3 g/dL   Albumin 4.0  3.5 - 5.2 g/dL   AST 49 (*) 0 - 37 U/L   ALT 52  0 - 53 U/L   Alkaline Phosphatase 171 (*) 39 - 117 U/L   Total Bilirubin 0.8  0.3 - 1.2 mg/dL   GFR calc non Af Amer >90  >90 mL/min   GFR calc Af Amer >90  >90 mL/min  RETICULOCYTES      Result Value Ref Range   Retic Ct Pct 8.3 (*) 0.4 - 3.1 %   RBC. 2.74 (*) 4.22 - 5.81 MIL/uL   Retic Count, Manual 227.4 (*) 19.0 - 186.0 K/uL  POCT I-STAT TROPONIN I      Result Value Ref Range   Troponin i, poc 0.03  0.00 - 0.08 ng/mL   Comment 3                Imaging Review Dg Chest 2 View  04/14/2014   CLINICAL DATA:  Fever and chest pain for 3 days, past history pulmonary embolism, stroke, sickle cell anemia, acute coronary syndrome  EXAM: CHEST  2 VIEW  COMPARISON:  03/31/2013  FINDINGS: Normal heart size, mediastinal contours, and pulmonary vascularity.  Lungs clear.  No pleural effusion or pneumothorax.  No acute osseous findings.  IMPRESSION: No acute abnormalities.   Electronically Signed   By: Lavonia Dana M.D.   On: 04/14/2014 10:35   Nm Pulmonary Perf And Vent  04/14/2014   CLINICAL DATA:  sickle cell, chest pain, hx of PE  EXAM: NUCLEAR MEDICINE VENTILATION - PERFUSION LUNG SCAN  TECHNIQUE: Ventilation images were obtained in multiple projections using inhaled aerosol technetium 99 M DTPA. Perfusion images were obtained in multiple projections after intravenous injection of Tc-9m MAA.  RADIOPHARMACEUTICALS:  44.0 MCi Tc-68m DTPA aerosol and 5.5 MCi Tc-47m MAA  COMPARISON:  Two-view chest radiograph dated 04/14/2014  FINDINGS: Ventilation: No focal ventilation defect.  Perfusion: No wedge shaped peripheral perfusion defects to suggest acute pulmonary embolism.  IMPRESSION: Negative nuclear medicine ventilation perfusion lung scan for pulmonary embolus.   Electronically Signed   By: Margaree Mackintosh M.D.   On: 04/14/2014  13:22     EKG Interpretation   Date/Time:  Friday April 14 2014 09:12:39 EDT Ventricular Rate:  111 PR Interval:  132 QRS Duration: 87 QT Interval:  311 QTC Calculation: 423 R  Axis:   59 Text Interpretation:  Sinus tachycardia Probable left atrial enlargement  Baseline wander in lead(s) V4 Since last tracing rate faster Confirmed by  KNAPP  MD-I, IVA ([id number]) on 04/14/2014 9:30:54 AM      MDM   Final diagnoses:  Chest pain syndrome  Sickle cell crisis  Fever, unspecified fever cause    Plan admission  Rolland Porter, MD, Alanson Aly, MD 04/14/14 [phone]

## 2014-04-14 NOTE — ED Notes (Signed)
inpt md at bedside

## 2014-04-14 NOTE — ED Notes (Signed)
Per Dr Ardeen Garland, radiologist, he recommends pt get CT angio test instead of nm pulmonary perfusion. Pt requesting perfusion test because pt has received 2 CT scans in past year. rn explain both tests would omit radiation, and perfusion test scan is not as good at detecting blood clots. Pt still request perfusion test instead.  Nuclear medicine made aware.

## 2014-04-14 NOTE — Progress Notes (Signed)
Two phlebotomy techs have made separate attempts to draw labs, and were not successful. Paged Hospitalist and received order to have IV Team insert a PICC line for labs and IV maintenance.

## 2014-04-14 NOTE — ED Notes (Signed)
Pt states 102 fever since last night.  Denies congestion/cough.  Pt also states chest pain x 3 days.  Pt has hx of Acute coronary syndrome and PE.  Pt on xarelto since 2014.

## 2014-04-14 NOTE — Progress Notes (Signed)
Peripherally Inserted Central Catheter/Midline Placement  The IV Nurse has discussed with the patient and/or persons authorized to consent for the patient, the purpose of this procedure and the potential benefits and risks involved with this procedure.  The benefits include less needle sticks, lab draws from the catheter and patient may be discharged home with the catheter.  Risks include, but not limited to, infection, bleeding, blood clot (thrombus formation), and puncture of an artery; nerve damage and irregular heat beat.  Alternatives to this procedure were also discussed.  PICC/Midline Placement Documentation        Justin Sharp 04/14/2014, 8:15 PM

## 2014-04-15 LAB — URINE CULTURE
CULTURE: NO GROWTH
Colony Count: NO GROWTH

## 2014-04-15 MED ORDER — ONDANSETRON HCL 4 MG/2ML IJ SOLN: 4.0000 mg | Freq: Four times a day (QID) | INTRAMUSCULAR | Status: DC | PRN

## 2014-04-15 MED ORDER — HYDROMORPHONE 0.3 MG/ML IV SOLN: INTRAVENOUS | Status: DC

## 2014-04-15 MED ORDER — NALOXONE HCL 0.4 MG/ML IJ SOLN
0.4000 mg | INTRAMUSCULAR | Status: DC | PRN
Start: 2014-04-15 — End: 2014-04-21

## 2014-04-15 MED ORDER — HYDROMORPHONE HCL PF 2 MG/ML IJ SOLN
2.0000 mg | Freq: Once | INTRAMUSCULAR | Status: AC
  Administered 2014-04-15: 2 mg via INTRAVENOUS
  Filled 2014-04-15: qty 1

## 2014-04-15 MED ORDER — HYDROMORPHONE 0.3 MG/ML IV SOLN
INTRAVENOUS | Status: DC
  Administered 2014-04-15: 13.88 mg via INTRAVENOUS
  Administered 2014-04-15: 15:00:00 via INTRAVENOUS
  Administered 2014-04-15: 0.7 mg via INTRAVENOUS
  Administered 2014-04-16: 7.74 mg via INTRAVENOUS
  Administered 2014-04-16: 6.29 mg via INTRAVENOUS
  Administered 2014-04-16: 8.6 mg via INTRAVENOUS
  Filled 2014-04-15 (×6): qty 25

## 2014-04-15 MED ORDER — SODIUM CHLORIDE 0.9 % IJ SOLN: 9.0000 mL | INTRAMUSCULAR | Status: DC | PRN

## 2014-04-15 MED ORDER — DIPHENHYDRAMINE HCL 25 MG PO CAPS: 25.0000 mg | ORAL_CAPSULE | Freq: Four times a day (QID) | ORAL | Status: DC | PRN

## 2014-04-15 NOTE — Progress Notes (Signed)
Patient ID: Justin Sharp, male   DOB: 08/17/1986, 28 y.o.   MRN: [id number]  SICKLE CELL SERVICE PROGRESS NOTE  Justin Sharp [id number] DOB: 01-01-1986 DOA: 04/14/2014 PCP: Jordan Likes, MD  Assessment/Plan: Principal Problem:   Acute sickle cell crisis Active Problems:   CAP (community acquired pneumonia)   Sickle cell anemia   Chest pain  1. Sickle Cell Crisis. PCA started last night but discontinued this morning. Pt complained of a 10/10 pain mostly felt in his chest wall. Will give one time dose of Dilaudid 2mg  and start PCA at 0.01mg /kg per protocol. Continue Ketorolac 30mg  q6h. Discontinued acetaminophen and PO Dilaudid. Labs in the AM-BMP, CBCD, Sickle screen, T&S. 2. Concern for CAP/ACS: less likely as pt does not have a cough and CXR was without any findings. Pain reported to be felt in chest wall and not with breathing. O2 sats are above 96%. Continue to monitor. Start incentive spirometry. 3. Fever: Highest temp of 100F recorded on presentation. Pt reported fever at home. On Vanc/Zosyn that was started for CAP. Will continue until blood cultures are negative at 48 hours. 4. History of thrombosis: Continue Rivaroxaban  Code Status: Full Family Communication: Pt will have family come by today Disposition Plan: Once he has improvement in his pain and blood cultures are negative at 48 hours.  Kalman Shan  Pager [phone]. If 7PM-7AM, please contact night-coverage.  04/15/2014, 1:44 PM  LOS: 1 day   Brief narrative: Pt states that he is having 10/10 pain mostly felt in his chest wall. No dyspnea or SOB. Brother had cold prior to him having a fever of 102F at home 24 hours  Prior to admission. His eating is fair and he is going too the bathroom without any problems. His PCA was started last night but stopped this morning.  Consultants:  none  Procedures:  none  Antibiotics:  Vanc and Zosyn  HPI/Subjective: 28 year old very pleasant young male with past  medical history of sickle cell disease (SS) who presented to [facility] ED 04/14/2014 with worsening mid to left sternal chest pain for past few days prior to this admission, 10/10 in intensity, radiating to his back and worse with deep breathing. Patient reported pain did not get better with rest or with dilaudid he takes at home. No significant joint pains reported. No reports of fatigue or weakness. No shortness of breath. He does report having cough but no fevers. In ED, he reported however he did not have cough but had some shortness of breath. No abdominal pain, nausea or vomiting. No lightheadedness or loss of consciousness.  In ED, his BP was 109/82, HR 91-112, RR 14-19, oxygen saturation 99% on room air. CXR did not show acute cardiopulmonary process. V/Q scan was negative for pulmonary embolism. V/Q scan done per pt request as he did not want CT angio chest).  ED medications: NS 1 L; dilaudid 2 mg IV x 3 doses; Zofran 4 mg IV x 1 dose.  Objective: Filed Vitals:   04/14/14 1530 04/14/14 1604 04/14/14 2212 04/15/14 1329  BP: 116/65 114/71 106/69   Pulse:  94 84   Temp:  98.2 F (36.8 C) 97.6 F (36.4 C)   TempSrc:  Oral Oral   Resp: 19 18 18 18   Height:  6\' 1"  (1.854 m)    Weight:  154 lb 5.2 oz (70 kg)    SpO2:  99% 96% 96%   Weight change:  No intake or output data in the 24 hours ending 04/15/14  1344  General: Alert, awake, oriented x3, in no acute distress.  HEENT: [place]/AT PEERL, EOMI Neck: Trachea midline,  no masses, no thyromegal,y no JVD, no carotid bruit OROPHARYNX:  Moist, No exudate/ erythema/lesions.  Heart: Regular rate and rhythm, without murmurs, rubs, gallops, PMI non-displaced, no heaves or thrills on palpation.  Lungs: Clear to auscultation, no wheezing or rhonchi noted. No increased vocal fremitus resonant to percussion  Abdomen: Soft, nontender, nondistended, positive bowel sounds, no masses no hepatosplenomegaly noted..  Neuro: No focal neurological deficits noted  cranial nerves II through XII grossly intact. DTRs 2+ bilaterally upper and lower extremities. Strength 5 out of 5 in bilateral upper and lower extremities. Musculoskeletal: No warm swelling or erythema around joints, no spinal tenderness noted. Psychiatric: Patient alert and oriented x3, good insight and cognition, good recent to remote recall. Lymph node survey: No cervical axillary or inguinal lymphadenopathy noted.  Data Reviewed: Basic Metabolic Panel:  Recent Labs Lab 04/14/14 0945  NA 142  K 3.9  CL 105  CO2 25  GLUCOSE 76  BUN 8  CREATININE 0.55  CALCIUM 9.3   Liver Function Tests:  Recent Labs Lab 04/14/14 0945  AST 35  ALT 39  ALKPHOS 201*  BILITOT 0.9  PROT 9.1*  ALBUMIN 3.6   No results found for this basename: LIPASE, AMYLASE,  in the last 168 hours No results found for this basename: AMMONIA,  in the last 168 hours CBC:  Recent Labs Lab 04/14/14 0945  WBC 11.8*  NEUTROABS 9.8*  HGB 8.2*  HCT 24.3*  MCV 96.0  PLT 437*   Cardiac Enzymes:  Recent Labs Lab 04/14/14 0935  TROPONINI <0.30   BNP (last 3 results) No results found for this basename: PROBNP,  in the last 8760 hours CBG: No results found for this basename: GLUCAP,  in the last 168 hours  No results found for this or any previous visit (from the past 240 hour(s)).   Studies: Dg Chest 2 View  04/14/2014   CLINICAL DATA:  Fever and chest pain for 3 days, past history pulmonary embolism, stroke, sickle cell anemia, acute coronary syndrome  EXAM: CHEST  2 VIEW  COMPARISON:  03/31/2013  FINDINGS: Normal heart size, mediastinal contours, and pulmonary vascularity.  Lungs clear.  No pleural effusion or pneumothorax.  No acute osseous findings.  IMPRESSION: No acute abnormalities.   Electronically Signed   By: Lavonia Dana M.D.   On: 04/14/2014 10:35   Nm Pulmonary Perf And Vent  04/14/2014   CLINICAL DATA:  sickle cell, chest pain, hx of PE  EXAM: NUCLEAR MEDICINE VENTILATION - PERFUSION  LUNG SCAN  TECHNIQUE: Ventilation images were obtained in multiple projections using inhaled aerosol technetium 99 M DTPA. Perfusion images were obtained in multiple projections after intravenous injection of Tc-65m MAA.  RADIOPHARMACEUTICALS:  44.0 MCi Tc-22m DTPA aerosol and 5.5 MCi Tc-31m MAA  COMPARISON:  Two-view chest radiograph dated 04/14/2014  FINDINGS: Ventilation: No focal ventilation defect.  Perfusion: No wedge shaped peripheral perfusion defects to suggest acute pulmonary embolism.  IMPRESSION: Negative nuclear medicine ventilation perfusion lung scan for pulmonary embolus.   Electronically Signed   By: Margaree Mackintosh M.D.   On: 04/14/2014 13:22    Scheduled Meds: . folic acid  1 mg Oral Daily  . HYDROmorphone PCA 0.3 mg/mL   Intravenous 6 times per day  . hydroxyurea  1,000 mg Oral Q breakfast  . piperacillin-tazobactam (ZOSYN)  IV  3.375 g Intravenous 3 times per day  .  rivaroxaban  20 mg Oral Daily  . vancomycin  1,000 mg Intravenous Q8H   Continuous Infusions: . sodium chloride 150 mL/hr at 04/14/14 2159    Principal Problem:   Acute sickle cell crisis Active Problems:   CAP (community acquired pneumonia)   Sickle cell anemia   Chest pain    Brayleigh Rybacki, Juliene Pina

## 2014-04-15 NOTE — Progress Notes (Signed)
Per lab, a.m. labs cancelled until PICC line insertion. Labs to be reordered:magnesium, reticulocytes, CMP with diff and troponinsx3.

## 2014-04-16 LAB — BASIC METABOLIC PANEL
BUN: 5 mg/dL — AB (ref 6–23)
CALCIUM: 8.5 mg/dL (ref 8.4–10.5)
CO2: 24 mEq/L (ref 19–32)
Chloride: 104 mEq/L (ref 96–112)
Creatinine, Ser: 0.56 mg/dL (ref 0.50–1.35)
GFR calc non Af Amer: >90 mL/min (ref >90)
GLUCOSE: 89 mg/dL (ref 70–99)
Potassium: 4.5 mEq/L (ref 3.7–5.3)
Sodium: 141 mEq/L (ref 137–147)

## 2014-04-16 LAB — CBC WITH DIFFERENTIAL/PLATELET
BASOS ABS: 0 10*3/uL (ref 0.0–0.1)
BASOS PCT: 0 % (ref 0–1)
EOS ABS: 0.3 10*3/uL (ref 0.0–0.7)
Eosinophils Relative: 2 % (ref 0–5)
HCT: 20.6 % — ABNORMAL LOW (ref 39.0–52.0)
Hemoglobin: 6.9 g/dL — CL (ref 13.0–17.0)
LYMPHS ABS: 1.7 10*3/uL (ref 0.7–4.0)
LYMPHS PCT: 11 % — AB (ref 12–46)
MCH: 31.5 pg (ref 26.0–34.0)
MCHC: 33.5 g/dL (ref 30.0–36.0)
MCV: 94.1 fL (ref 78.0–100.0)
MONO ABS: 1.4 10*3/uL — AB (ref 0.1–1.0)
Monocytes Relative: 9 % (ref 3–12)
NEUTROS ABS: 11.8 10*3/uL — AB (ref 1.7–7.7)
Neutrophils Relative %: 78 % — ABNORMAL HIGH (ref 43–77)
Platelets: 779 10*3/uL — ABNORMAL HIGH (ref 150–400)
RBC: 2.19 MIL/uL — ABNORMAL LOW (ref 4.22–5.81)
RDW: 21.7 % — AB (ref 11.5–15.5)
WBC: 15.2 10*3/uL — ABNORMAL HIGH (ref 4.0–10.5)

## 2014-04-16 LAB — RETICULOCYTES
RBC.: 2.19 MIL/uL — ABNORMAL LOW (ref 4.22–5.81)
RETIC COUNT ABSOLUTE: 321.9 10*3/uL — AB (ref 19.0–186.0)
Retic Ct Pct: 14.7 % — ABNORMAL HIGH (ref 0.4–3.1)

## 2014-04-16 LAB — LACTATE DEHYDROGENASE: LDH: 302 U/L — AB (ref 94–250)

## 2014-04-16 MED ORDER — DEXTROSE-NACL 5-0.45 % IV SOLN
INTRAVENOUS | Status: DC
  Administered 2014-04-16 – 2014-04-20 (×6): via INTRAVENOUS

## 2014-04-16 MED ORDER — HYDROMORPHONE HCL 4 MG PO TABS
4.0000 mg | ORAL_TABLET | ORAL | Status: DC
  Administered 2014-04-16 – 2014-04-20 (×25): 4 mg via ORAL
  Filled 2014-04-16 (×25): qty 1

## 2014-04-16 MED ORDER — HYDROMORPHONE 0.3 MG/ML IV SOLN
INTRAVENOUS | Status: DC
  Administered 2014-04-16: 7.48 mg via INTRAVENOUS
  Administered 2014-04-16: 12.88 mg via INTRAVENOUS
  Administered 2014-04-16: 7.48 mg via INTRAVENOUS
  Administered 2014-04-16: 7.65 mg via INTRAVENOUS
  Administered 2014-04-17: 0.3 mg via INTRAVENOUS
  Administered 2014-04-17: 7.52 mg via INTRAVENOUS
  Administered 2014-04-17: 13:00:00 via INTRAVENOUS
  Administered 2014-04-17: 7.48 mg via INTRAVENOUS
  Filled 2014-04-16 (×8): qty 25

## 2014-04-16 NOTE — Progress Notes (Signed)
Patient ID: Justin Sharp, male   DOB: 1986-06-17, 28 y.o.   MRN: [id number] SICKLE CELL SERVICE PROGRESS NOTE  Justin Sharp [id number] DOB: 03-14-1986 DOA: 04/14/2014 PCP: Jordan Likes, MD  Assessment/Plan: Principal Problem:   Acute sickle cell crisis Active Problems:   CAP (community acquired pneumonia)   Sickle cell anemia   Chest pain  1. Sickle Cell Crisis. Pt states that pain is improved. Will start his home Dilaudid PO 4mg  q4h. Decrease PCA from every 53min to ever65min. Continue Ketorolac 30mg  q6h. Labs in the AM-BMP, CBCD. 2. Concern for CAP/ACS: less likely as pt does not have a cough and CXR was without any findings. Pain reported to be felt in chest wall and not with breathing. O2 sats are above 96%. Continue to monitor. Start incentive spirometry. 3. Fever: Highest temp of 100F recorded on presentation. Pt reported fever at home. On Vanc/Zosyn that was started for CAP. Blood cultures are negative at 48 hours, so will discontinue antibiotics. 4. History of thrombosis: Continue Rivaroxaban  Code Status: Full Family Communication: none Disposition Plan: Once he has improvement in his pain, and is controlled with all PO. Likely d/c tomorrow.  Kalman Shan  Pager [phone]. If 7PM-7AM, please contact night-coverage.  04/16/2014, 3:39 PM  LOS: 2 days   Brief narrative: Pt states that his pain is better at a 6-7/10. No cough, SOB, constipation, or vomiting.  Consultants:  none  Procedures:  none  Antibiotics:  Vanc and Zosyn-discontinued  HPI/Subjective: 28 year old very pleasant young male with past medical history of sickle cell disease (SS) who presented to [facility] ED 04/14/2014 with worsening mid to left sternal chest pain for past few days prior to this admission, 10/10 in intensity, radiating to his back and worse with deep breathing. Patient reported pain did not get better with rest or with dilaudid he takes at home. No significant joint pains reported. No  reports of fatigue or weakness. No shortness of breath. He does report having cough but no fevers. In ED, he reported however he did not have cough but had some shortness of breath. No abdominal pain, nausea or vomiting. No lightheadedness or loss of consciousness.  In ED, his BP was 109/82, HR 91-112, RR 14-19, oxygen saturation 99% on room air. CXR did not show acute cardiopulmonary process. V/Q scan was negative for pulmonary embolism. V/Q scan done per pt request as he did not want CT angio chest).  ED medications: NS 1 L; dilaudid 2 mg IV x 3 doses; Zofran 4 mg IV x 1 dose.  Objective: Filed Vitals:   04/16/14 0818 04/16/14 1137 04/16/14 1437 04/16/14 1457  BP:   108/72   Pulse:   110   Temp:   97.5 F (36.4 C)   TempSrc:   Oral   Resp: 16 12 16 12   Height:      Weight:      SpO2: 97% 98% 94% 98%   Weight change:   Intake/Output Summary (Last 24 hours) at 04/16/14 1539 Last data filed at 04/16/14 1100  Gross per 24 hour  Intake    600 ml  Output      2 ml  Net    598 ml    General: Alert, awake, oriented x3, in no acute distress.  HEENT: [place]/AT PEERL, EOMI Neck: Trachea midline,  no masses, no thyromegal,y no JVD, no carotid bruit OROPHARYNX:  Moist, No exudate/ erythema/lesions.  Heart: Regular rate and rhythm, without murmurs, rubs, gallops, PMI non-displaced, no heaves or  thrills on palpation.  Lungs: Clear to auscultation, no wheezing or rhonchi noted. No increased vocal fremitus resonant to percussion  Abdomen: Soft, nontender, nondistended, positive bowel sounds, no masses no hepatosplenomegaly noted. MSK: no cyanosis or edema  Data Reviewed: Basic Metabolic Panel:  Recent Labs Lab 04/14/14 0945 04/16/14 0809  NA 142 141  K 3.9 4.5  CL 105 104  CO2 25 24  GLUCOSE 76 89  BUN 8 5*  CREATININE 0.55 0.56  CALCIUM 9.3 8.5   Liver Function Tests:  Recent Labs Lab 04/14/14 0945  AST 35  ALT 39  ALKPHOS 201*  BILITOT 0.9  PROT 9.1*  ALBUMIN 3.6   No  results found for this basename: LIPASE, AMYLASE,  in the last 168 hours No results found for this basename: AMMONIA,  in the last 168 hours CBC:  Recent Labs Lab 04/14/14 0945 04/16/14 0809  WBC 11.8* 15.2*  NEUTROABS 9.8* 11.8*  HGB 8.2* 6.9*  HCT 24.3* 20.6*  MCV 96.0 94.1  PLT 437* 779*   Cardiac Enzymes:  Recent Labs Lab 04/14/14 0935  TROPONINI <0.30   BNP (last 3 results) No results found for this basename: PROBNP,  in the last 8760 hours CBG: No results found for this basename: GLUCAP,  in the last 168 hours  Recent Results (from the past 240 hour(s))  CULTURE, BLOOD (ROUTINE X 2)     Status: None   Collection Time    04/14/14  9:45 AM      Result Value Ref Range Status   Specimen Description BLOOD LEFT ARM   Final   Special Requests BOTTLES DRAWN AEROBIC AND ANAEROBIC 5CC EACH   Final   Culture  Setup Time     Final   Value: 04/14/2014 12:38     Performed at [facility]   Culture     Final   Value:        BLOOD CULTURE RECEIVED NO GROWTH TO DATE CULTURE WILL BE HELD FOR 5 DAYS BEFORE ISSUING A FINAL NEGATIVE REPORT     Performed at [facility]   Report Status PENDING   Incomplete  CULTURE, BLOOD (ROUTINE X 2)     Status: None   Collection Time    04/14/14 10:14 AM      Result Value Ref Range Status   Specimen Description BLOOD   Final   Special Requests     Final   Value: BOTTLES DRAWN AEROBIC AND ANAEROBIC RIGHT HAND 1 CC   Culture  Setup Time     Final   Value: 04/14/2014 12:38     Performed at [facility]   Culture     Final   Value:        BLOOD CULTURE RECEIVED NO GROWTH TO DATE CULTURE WILL BE HELD FOR 5 DAYS BEFORE ISSUING A FINAL NEGATIVE REPORT     Performed at [facility]   Report Status PENDING   Incomplete  URINE CULTURE     Status: None   Collection Time    04/14/14 12:27 PM      Result Value Ref Range Status   Specimen Description URINE, CLEAN CATCH   Final   Special Requests Immunocompromised    Final   Culture  Setup Time     Final   Value: 04/14/2014 17:14     Performed at [place]     Final   Value: NO GROWTH     Performed  at [facility]     Final   Value: NO GROWTH     Performed at [facility]   Report Status 04/15/2014 FINAL   Final     Studies: Dg Chest 2 View  04/14/2014   CLINICAL DATA:  Fever and chest pain for 3 days, past history pulmonary embolism, stroke, sickle cell anemia, acute coronary syndrome  EXAM: CHEST  2 VIEW  COMPARISON:  03/31/2013  FINDINGS: Normal heart size, mediastinal contours, and pulmonary vascularity.  Lungs clear.  No pleural effusion or pneumothorax.  No acute osseous findings.  IMPRESSION: No acute abnormalities.   Electronically Signed   By: Lavonia Dana M.D.   On: 04/14/2014 10:35   Nm Pulmonary Perf And Vent  04/14/2014   CLINICAL DATA:  sickle cell, chest pain, hx of PE  EXAM: NUCLEAR MEDICINE VENTILATION - PERFUSION LUNG SCAN  TECHNIQUE: Ventilation images were obtained in multiple projections using inhaled aerosol technetium 99 M DTPA. Perfusion images were obtained in multiple projections after intravenous injection of Tc-28m MAA.  RADIOPHARMACEUTICALS:  44.0 MCi Tc-57m DTPA aerosol and 5.5 MCi Tc-50m MAA  COMPARISON:  Two-view chest radiograph dated 04/14/2014  FINDINGS: Ventilation: No focal ventilation defect.  Perfusion: No wedge shaped peripheral perfusion defects to suggest acute pulmonary embolism.  IMPRESSION: Negative nuclear medicine ventilation perfusion lung scan for pulmonary embolus.   Electronically Signed   By: Margaree Mackintosh M.D.   On: 04/14/2014 13:22    Scheduled Meds: . folic acid  1 mg Oral Daily  . HYDROmorphone PCA 0.3 mg/mL   Intravenous 6 times per day  . HYDROmorphone  4 mg Oral Q4H  . hydroxyurea  1,000 mg Oral Q breakfast  . rivaroxaban  20 mg Oral Daily   Continuous Infusions: . dextrose 5 % and 0.45% NaCl      Principal Problem:   Acute sickle cell  crisis Active Problems:   CAP (community acquired pneumonia)   Sickle cell anemia   Chest pain    AGBOOLA, Juliene Pina

## 2014-04-17 LAB — CBC WITH DIFFERENTIAL/PLATELET
BASOS ABS: 0 10*3/uL (ref 0.0–0.1)
Basophils Relative: 0 % (ref 0–1)
EOS PCT: 1 % (ref 0–5)
Eosinophils Absolute: 0.2 10*3/uL (ref 0.0–0.7)
HEMATOCRIT: 20.5 % — AB (ref 39.0–52.0)
Hemoglobin: 7 g/dL — ABNORMAL LOW (ref 13.0–17.0)
LYMPHS PCT: 13 % (ref 12–46)
Lymphs Abs: 1.6 10*3/uL (ref 0.7–4.0)
MCH: 32 pg (ref 26.0–34.0)
MCHC: 34.1 g/dL (ref 30.0–36.0)
MCV: 93.6 fL (ref 78.0–100.0)
MONO ABS: 0.9 10*3/uL (ref 0.1–1.0)
Monocytes Relative: 7 % (ref 3–12)
NEUTROS ABS: 9.8 10*3/uL — AB (ref 1.7–7.7)
Neutrophils Relative %: 79 % — ABNORMAL HIGH (ref 43–77)
PLATELETS: 798 10*3/uL — AB (ref 150–400)
RBC: 2.19 MIL/uL — ABNORMAL LOW (ref 4.22–5.81)
RDW: 20.7 % — ABNORMAL HIGH (ref 11.5–15.5)
WBC: 12.5 10*3/uL — ABNORMAL HIGH (ref 4.0–10.5)

## 2014-04-17 LAB — BASIC METABOLIC PANEL
BUN: 6 mg/dL (ref 6–23)
CHLORIDE: 99 meq/L (ref 96–112)
CO2: 27 mEq/L (ref 19–32)
CREATININE: 0.55 mg/dL (ref 0.50–1.35)
Calcium: 8.7 mg/dL (ref 8.4–10.5)
GFR calc Af Amer: >90 mL/min (ref >90)
GFR calc non Af Amer: >90 mL/min (ref >90)
Glucose, Bld: 100 mg/dL — ABNORMAL HIGH (ref 70–99)
Potassium: 3.8 mEq/L (ref 3.7–5.3)
Sodium: 138 mEq/L (ref 137–147)

## 2014-04-17 LAB — TYPE AND SCREEN
ABO/RH(D): A POS
Antibody Screen: POSITIVE
DAT, IGG: NEGATIVE

## 2014-04-17 LAB — RETICULOCYTES
RBC.: 2.19 MIL/uL — AB (ref 4.22–5.81)
Retic Count, Absolute: 306.6 10*3/uL — ABNORMAL HIGH (ref 19.0–186.0)
Retic Ct Pct: 14 % — ABNORMAL HIGH (ref 0.4–3.1)

## 2014-04-17 LAB — LACTATE DEHYDROGENASE: LDH: 324 U/L — AB (ref 94–250)

## 2014-04-17 MED ORDER — HYDROMORPHONE 0.3 MG/ML IV SOLN
INTRAVENOUS | Status: DC
  Administered 2014-04-17: 5.49 mg via INTRAVENOUS
  Administered 2014-04-17: 12.41 mg via INTRAVENOUS
  Administered 2014-04-17 – 2014-04-18 (×3): via INTRAVENOUS
  Administered 2014-04-18: 7.5 mg via INTRAVENOUS
  Administered 2014-04-18 (×2): via INTRAVENOUS
  Administered 2014-04-18: 4.99 mg via INTRAVENOUS
  Administered 2014-04-18 – 2014-04-19 (×2): via INTRAVENOUS
  Administered 2014-04-19: 0.3 mg via INTRAVENOUS
  Administered 2014-04-19: 22:00:00 via INTRAVENOUS
  Administered 2014-04-19 (×2): 6.49 mg via INTRAVENOUS
  Administered 2014-04-19: 17:00:00 via INTRAVENOUS
  Administered 2014-04-19: 3.78 mg via INTRAVENOUS
  Administered 2014-04-20: 5.24 mg via INTRAVENOUS
  Administered 2014-04-20: 14:00:00 via INTRAVENOUS
  Administered 2014-04-20 (×2): 0.3 mg via INTRAVENOUS
  Administered 2014-04-20: 12.13 mg via INTRAVENOUS
  Filled 2014-04-17 (×13): qty 25

## 2014-04-17 NOTE — Progress Notes (Signed)
Patient ID: Justin Sharp, male   DOB: 20-Jul-1986, 28 y.o.   MRN: [id number] SICKLE CELL SERVICE PROGRESS NOTE  Justin Sharp [id number] DOB: Nov 03, 1985 DOA: 04/14/2014 PCP: Jordan Likes, MD  Assessment/Plan: Principal Problem:   Acute sickle cell crisis Active Problems:   CAP (community acquired pneumonia)   Sickle cell anemia   Chest pain  1. Sickle Cell Crisis. Pt states that pain is improved. Continue PCA. May discontinue PCA this evening, if pain is improved. Continue Ketorolac 30mg  q6h. Hgb stable at 7.0. Continue home meds.  2. Fever: Highest temp of 100F recorded on presentation. Pt reported fever at home. On Vanc/Zosyn that was started for CAP. Blood cultures are negative at 48 hours, so Vanc/Zosyn was discontinued on 6/29.  3. History of thrombosis: Continue Rivaroxaban  Code Status: Full Family Communication: none Disposition Plan: Once he has improvement in his pain, and is controlled with all PO. Likely d/c tomorrow.  Kalman Shan  Pager [phone]. If 7PM-7AM, please contact night-coverage.  04/17/2014, 11:28 AM  LOS: 3 days   Brief narrative: Pt states that his pain is the same at 6-7/10. No cough, SOB, constipation, or vomiting.  Consultants:  none  Procedures:  none  Antibiotics:  none  HPI/Subjective: 28 year old very pleasant young male with past medical history of sickle cell disease (SS) who presented to [facility] ED 04/14/2014 with worsening mid to left sternal chest pain for past few days prior to this admission, 10/10 in intensity, radiating to his back and worse with deep breathing. Patient reported pain did not get better with rest or with dilaudid he takes at home. No significant joint pains reported. No reports of fatigue or weakness. No shortness of breath. He does report having cough but no fevers. In ED, he reported however he did not have cough but had some shortness of breath. No abdominal pain, nausea or vomiting. No lightheadedness or loss  of consciousness.  In ED, his BP was 109/82, HR 91-112, RR 14-19, oxygen saturation 99% on room air. CXR did not show acute cardiopulmonary process. V/Q scan was negative for pulmonary embolism. V/Q scan done per pt request as he did not want CT angio chest).  ED medications: NS 1 L; dilaudid 2 mg IV x 3 doses; Zofran 4 mg IV x 1 dose.  Objective: Filed Vitals:   04/17/14 0416 04/17/14 0617 04/17/14 0955 04/17/14 1023  BP: 112/76   113/65  Pulse: 107   114  Temp: 98.3 F (36.8 C)   98.9 F (37.2 C)  TempSrc: Oral   Oral  Resp: 16 10 12 14   Height:      Weight:      SpO2: 97% 95% 94% 95%   Weight change:   Intake/Output Summary (Last 24 hours) at 04/17/14 1128 Last data filed at 04/16/14 1700  Gross per 24 hour  Intake    360 ml  Output      1 ml  Net    359 ml    General: Alert, awake, oriented x3, in no acute distress.  HEENT: [place]/AT PEERL, EOMI Neck: Trachea midline,  no masses, no thyromegal,y no JVD, no carotid bruit OROPHARYNX:  Moist, No exudate/ erythema/lesions.  Heart: Regular rate and rhythm, without murmurs, rubs, gallops, PMI non-displaced, no heaves or thrills on palpation.  Lungs: Clear to auscultation, no wheezing or rhonchi noted. No increased vocal fremitus resonant to percussion  Abdomen: Soft, nontender, nondistended, positive bowel sounds, no masses no hepatosplenomegaly noted. MSK: no cyanosis or edema  -  no changes  Data Reviewed: Basic Metabolic Panel:  Recent Labs Lab 04/14/14 0945 04/16/14 0809 04/17/14 0809  NA 142 141 138  K 3.9 4.5 3.8  CL 105 104 99  CO2 25 24 27   GLUCOSE 76 89 100*  BUN 8 5* 6  CREATININE 0.55 0.56 0.55  CALCIUM 9.3 8.5 8.7   Liver Function Tests:  Recent Labs Lab 04/14/14 0945  AST 35  ALT 39  ALKPHOS 201*  BILITOT 0.9  PROT 9.1*  ALBUMIN 3.6   No results found for this basename: LIPASE, AMYLASE,  in the last 168 hours No results found for this basename: AMMONIA,  in the last 168 hours CBC:  Recent  Labs Lab 04/14/14 0945 04/16/14 0809 04/17/14 0809  WBC 11.8* 15.2* 12.5*  NEUTROABS 9.8* 11.8* 9.8*  HGB 8.2* 6.9* 7.0*  HCT 24.3* 20.6* 20.5*  MCV 96.0 94.1 93.6  PLT 437* 779* 798*   Cardiac Enzymes:  Recent Labs Lab 04/14/14 0935  TROPONINI <0.30   BNP (last 3 results) No results found for this basename: PROBNP,  in the last 8760 hours CBG: No results found for this basename: GLUCAP,  in the last 168 hours  Recent Results (from the past 240 hour(s))  CULTURE, BLOOD (ROUTINE X 2)     Status: None   Collection Time    04/14/14  9:45 AM      Result Value Ref Range Status   Specimen Description BLOOD LEFT ARM   Final   Special Requests BOTTLES DRAWN AEROBIC AND ANAEROBIC 5CC EACH   Final   Culture  Setup Time     Final   Value: 04/14/2014 12:38     Performed at [facility]   Culture     Final   Value:        BLOOD CULTURE RECEIVED NO GROWTH TO DATE CULTURE WILL BE HELD FOR 5 DAYS BEFORE ISSUING A FINAL NEGATIVE REPORT     Performed at [facility]   Report Status PENDING   Incomplete  CULTURE, BLOOD (ROUTINE X 2)     Status: None   Collection Time    04/14/14 10:14 AM      Result Value Ref Range Status   Specimen Description BLOOD   Final   Special Requests     Final   Value: BOTTLES DRAWN AEROBIC AND ANAEROBIC RIGHT HAND 1 CC   Culture  Setup Time     Final   Value: 04/14/2014 12:38     Performed at [facility]   Culture     Final   Value:        BLOOD CULTURE RECEIVED NO GROWTH TO DATE CULTURE WILL BE HELD FOR 5 DAYS BEFORE ISSUING A FINAL NEGATIVE REPORT     Performed at [facility]   Report Status PENDING   Incomplete  URINE CULTURE     Status: None   Collection Time    04/14/14 12:27 PM      Result Value Ref Range Status   Specimen Description URINE, CLEAN CATCH   Final   Special Requests Immunocompromised   Final   Culture  Setup Time     Final   Value: 04/14/2014 17:14     Performed at [place]     Final   Value: NO GROWTH     Performed at [facility]   Culture     Final   Value: NO GROWTH  Performed at [facility]   Report Status 04/15/2014 FINAL   Final     Studies: Dg Chest 2 View  04/14/2014   CLINICAL DATA:  Fever and chest pain for 3 days, past history pulmonary embolism, stroke, sickle cell anemia, acute coronary syndrome  EXAM: CHEST  2 VIEW  COMPARISON:  03/31/2013  FINDINGS: Normal heart size, mediastinal contours, and pulmonary vascularity.  Lungs clear.  No pleural effusion or pneumothorax.  No acute osseous findings.  IMPRESSION: No acute abnormalities.   Electronically Signed   By: Lavonia Dana M.D.   On: 04/14/2014 10:35   Nm Pulmonary Perf And Vent  04/14/2014   CLINICAL DATA:  sickle cell, chest pain, hx of PE  EXAM: NUCLEAR MEDICINE VENTILATION - PERFUSION LUNG SCAN  TECHNIQUE: Ventilation images were obtained in multiple projections using inhaled aerosol technetium 99 M DTPA. Perfusion images were obtained in multiple projections after intravenous injection of Tc-78m MAA.  RADIOPHARMACEUTICALS:  44.0 MCi Tc-10m DTPA aerosol and 5.5 MCi Tc-38m MAA  COMPARISON:  Two-view chest radiograph dated 04/14/2014  FINDINGS: Ventilation: No focal ventilation defect.  Perfusion: No wedge shaped peripheral perfusion defects to suggest acute pulmonary embolism.  IMPRESSION: Negative nuclear medicine ventilation perfusion lung scan for pulmonary embolus.   Electronically Signed   By: Margaree Mackintosh M.D.   On: 04/14/2014 13:22    Scheduled Meds: . folic acid  1 mg Oral Daily  . HYDROmorphone PCA 0.3 mg/mL   Intravenous 6 times per day  . HYDROmorphone  4 mg Oral Q4H  . hydroxyurea  1,000 mg Oral Q breakfast  . rivaroxaban  20 mg Oral Daily   Continuous Infusions: . dextrose 5 % and 0.45% NaCl 75 mL/hr at 04/17/14 0418    Principal Problem:   Acute sickle cell crisis Active Problems:   CAP (community acquired pneumonia)   Sickle cell anemia    Chest pain    AGBOOLA, Juliene Pina

## 2014-04-18 ENCOUNTER — Inpatient Hospital Stay (HOSPITAL_COMMUNITY): Payer: Medicaid - Out of State

## 2014-04-18 DIAGNOSIS — R079 Chest pain, unspecified: Secondary | ICD-10-CM

## 2014-04-18 DIAGNOSIS — D5700 Hb-SS disease with crisis, unspecified: Secondary | ICD-10-CM

## 2014-04-18 LAB — HEMOGLOBIN AND HEMATOCRIT, BLOOD
HEMATOCRIT: 19.9 % — AB (ref 39.0–52.0)
HEMOGLOBIN: 6.7 g/dL — AB (ref 13.0–17.0)

## 2014-04-18 MED ORDER — LIDOCAINE HCL 1 % IJ SOLN
INTRAMUSCULAR | Status: AC
  Filled 2014-04-18: qty 20

## 2014-04-18 NOTE — Procedures (Signed)
Procedure:  Attempted PICC line placement in bilateral upper extremities, left arm venogram, right IJ central line placement Findings:  Miniscule upper arm veins in right upper arm.  Could not advance wire in right arm veins.  Small left arm veins as well.  Cephalic vein on left occluded.  Unable to advance PICC beyond left axilla.  Venography shows extravasation and venous stenosis. 5 Fr DL non-tunneled venous catheter placed via right IJ vein with tip at cavoatrial junction.  OK to use.   Patient is no longer candidate for PICC lines via either upper extremity in future.

## 2014-04-18 NOTE — Progress Notes (Signed)
CRITICAL VALUE ALERT  Critical value received:  Hgb 6.7   Date of notification:  04/18/2014  Time of notification:  6:13 AM   Critical value read back:Yes.    Nurse who received alert:  Blair Hailey, RN  MD notified (1st page):  Schorr, K.  Time of first page:  6:14 AM   MD notified (2nd page):  Time of second page:  Responding MD:  Schorr, K  Time MD responded:  6:31 AM

## 2014-04-18 NOTE — Progress Notes (Signed)
Subjective: 28 year old gentleman admitted with sickle cell painful crisis. Patient is not having pain is 6/10. He is on Dilaudid PCA but doing a little better. He has not been able to get out of bed and walk around yet. No fever no nausea vomiting or diarrhea. He denied any diaphoresis. He is still somewhat short of breath occasionally. He has used 22 mg of Dilaudid in the last 24 hours with 34 demands and 30 deliveries.  Objective: Vital signs in last 24 hours: Temp:  [98.1 F (36.7 C)-98.5 F (36.9 C)] 98.2 F (36.8 C) (06/30 0555) Pulse Rate:  [100-108] 100 (06/30 0555) Resp:  [10-16] 16 (06/30 0555) BP: (101-116)/(62-70) 105/65 mmHg (06/30 0555) SpO2:  [94 %-100 %] 96 % (06/30 0555) Weight change:  Last BM Date: 04/14/14  Intake/Output from previous day: 06/29 0701 - 06/30 0700 In: 600 [P.O.:600] Out: 3 [Urine:3] Intake/Output this shift:    General appearance: alert, cooperative and no distress Eyes: conjunctivae/corneas clear. PERRL, EOM's intact. Fundi benign. Back: symmetric, no curvature. ROM normal. No CVA tenderness. Resp: clear to auscultation bilaterally Chest wall: no tenderness Cardio: regular rate and rhythm, S1, S2 normal, no murmur, click, rub or gallop GI: soft, non-tender; bowel sounds normal; no masses,  no organomegaly Extremities: extremities normal, atraumatic, no cyanosis or edema Pulses: 2+ and symmetric Skin: Skin color, texture, turgor normal. No rashes or lesions Neurologic: Grossly normal  Lab Results:  Recent Labs  04/16/14 0809 04/17/14 0809 04/18/14 0545  WBC 15.2* 12.5*  --   HGB 6.9* 7.0* 6.7*  HCT 20.6* 20.5* 19.9*  PLT 779* 798*  --    BMET  Recent Labs  04/16/14 0809 04/17/14 0809  NA 141 138  K 4.5 3.8  CL 104 99  CO2 24 27  GLUCOSE 89 100*  BUN 5* 6  CREATININE 0.56 0.55  CALCIUM 8.5 8.7    Studies/Results: No results found.  Medications: I have reviewed the patient's current medications.  Assessment/Plan: A  28 year old man admitted with sickle cell painful crisis.  #1 sickle cell painful crisis: patient had some mild hemolytic crisis at this much improved now. He is back on his oral medications in the Dilaudid PCA. We'll continue overnight and mobilize the patient. Will begin to get him off the Dilaudid PCA if tolerated  #2 Sickle cell Anemia: H/H is stable.  #3 Leucocytosis: due to VOC. Continue therapy.  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  LOS: 4 days   GARBA,LAWAL 04/18/2014, 11:49 AM

## 2014-04-19 LAB — COMPREHENSIVE METABOLIC PANEL
ALBUMIN: 2.9 g/dL — AB (ref 3.5–5.2)
ALK PHOS: 127 U/L — AB (ref 39–117)
ALT: 18 U/L (ref 0–53)
AST: 21 U/L (ref 0–37)
BILIRUBIN TOTAL: 1 mg/dL (ref 0.3–1.2)
BUN: 8 mg/dL (ref 6–23)
CHLORIDE: 102 meq/L (ref 96–112)
CO2: 28 mEq/L (ref 19–32)
Calcium: 8.5 mg/dL (ref 8.4–10.5)
Creatinine, Ser: 0.56 mg/dL (ref 0.50–1.35)
GFR calc Af Amer: >90 mL/min (ref >90)
GFR calc non Af Amer: >90 mL/min (ref >90)
Glucose, Bld: 120 mg/dL — ABNORMAL HIGH (ref 70–99)
Potassium: 3.6 mEq/L — ABNORMAL LOW (ref 3.7–5.3)
Sodium: 141 mEq/L (ref 137–147)
Total Protein: 6.9 g/dL (ref 6.0–8.3)

## 2014-04-19 LAB — CBC WITH DIFFERENTIAL/PLATELET
BASOS ABS: 0 10*3/uL (ref 0.0–0.1)
BASOS PCT: 0 % (ref 0–1)
Eosinophils Absolute: 0.2 10*3/uL (ref 0.0–0.7)
Eosinophils Relative: 2 % (ref 0–5)
HEMATOCRIT: 18.8 % — AB (ref 39.0–52.0)
HEMOGLOBIN: 6.3 g/dL — AB (ref 13.0–17.0)
LYMPHS PCT: 15 % (ref 12–46)
Lymphs Abs: 1.6 10*3/uL (ref 0.7–4.0)
MCH: 31.8 pg (ref 26.0–34.0)
MCHC: 33.5 g/dL (ref 30.0–36.0)
MCV: 94.9 fL (ref 78.0–100.0)
MONOS PCT: 9 % (ref 3–12)
Monocytes Absolute: 1 10*3/uL (ref 0.1–1.0)
NEUTROS ABS: 8.1 10*3/uL — AB (ref 1.7–7.7)
Neutrophils Relative %: 74 % (ref 43–77)
Platelets: 777 10*3/uL — ABNORMAL HIGH (ref 150–400)
RBC: 1.98 MIL/uL — ABNORMAL LOW (ref 4.22–5.81)
RDW: 20.8 % — AB (ref 11.5–15.5)
WBC: 10.9 10*3/uL — AB (ref 4.0–10.5)

## 2014-04-19 MED ORDER — SODIUM CHLORIDE 0.9 % IJ SOLN
10.0000 mL | INTRAMUSCULAR | Status: DC | PRN
  Administered 2014-04-19 – 2014-04-20 (×2): 10 mL

## 2014-04-19 NOTE — Progress Notes (Signed)
CRITICAL VALUE ALERT  Critical value received:  Hemoglobin: 6.3    Date of notification:  04/19/14  Time of notification:  [id number]   Critical value read back:Yes.    Nurse who received alert:  Bobbye Charleston   Midlevel notified (1st page):  Schorr  Time of first page:  [phone]

## 2014-04-19 NOTE — Progress Notes (Signed)
Subjective: Patient is feeling better today but still in pain. His pain level is down to 6/10. When he gets out of bed it was as high as 8-9/10. He is on oral Dilaudid as well as the Dilaudid PCA. No shortness of breath, no cough no nausea vomiting or diarrhea. He feels he is not yet ready to go home at this rate.  Objective: Vital signs in last 24 hours: Temp:  [97.8 F (36.6 C)-100.2 F (37.9 C)] 97.8 F (36.6 C) (07/01 1700) Pulse Rate:  [69-112] 105 (07/01 1700) Resp:  [11-18] 16 (07/01 1700) BP: (98-116)/(61-68) 115/65 mmHg (07/01 1700) SpO2:  [93 %-99 %] 94 % (07/01 1700) Weight change:  Last BM Date: 04/18/14  Intake/Output from previous day: 06/30 0701 - 07/01 0700 In: 480 [P.O.:480] Out: 3 [Urine:3] Intake/Output this shift: Total I/O In: 720 [P.O.:720] Out: 2 [Urine:2]  General appearance: alert, cooperative and no distress Eyes: conjunctivae/corneas clear. PERRL, EOM's intact. Fundi benign. Back: symmetric, no curvature. ROM normal. No CVA tenderness. Resp: clear to auscultation bilaterally Chest wall: no tenderness Cardio: regular rate and rhythm, S1, S2 normal, no murmur, click, rub or gallop GI: soft, non-tender; bowel sounds normal; no masses,  no organomegaly Extremities: extremities normal, atraumatic, no cyanosis or edema Pulses: 2+ and symmetric Skin: Skin color, texture, turgor normal. No rashes or lesions Neurologic: Grossly normal  Lab Results:  Recent Labs  04/17/14 0809 04/18/14 0545 04/19/14 0436  WBC 12.5*  --  10.9*  HGB 7.0* 6.7* 6.3*  HCT 20.5* 19.9* 18.8*  PLT 798*  --  777*   BMET  Recent Labs  04/17/14 0809 04/19/14 0436  NA 138 141  K 3.8 3.6*  CL 99 102  CO2 27 28  GLUCOSE 100* 120*  BUN 6 8  CREATININE 0.55 0.56  CALCIUM 8.7 8.5    Studies/Results: Ir Veno/ext/uni Left  04/18/2014   CLINICAL DATA:  Sickle cell anemia and need for central venous access for transfusion and more durable IV access.  EXAM: 1.  ULTRASOUND-GUIDED VASCULAR ACCESS OF RIGHT BRACHIAL VEIN 2. ULTRASOUND-GUIDED VASCULAR ACCESS OF LEFT BRACHIAL VEIN 3. LEFT UPPER EXTREMITY VENOGRAPHY 4. NON-TUNNELED CENTRAL VENOUS CATHETER PLACEMENT WITH ULTRASOUND AND FLUOROSCOPIC GUIDANCE  FLUOROSCOPY TIME:  minutes  PROCEDURE: The procedure, risks, benefits, and alternatives were explained to the patient. Questions regarding the procedure were encouraged and answered. The patient understands and consents to the procedure.  During the course of the procedure, both upper arms as well as the right neck were prepped with chlorhexidine in a sterile fashion, and sterile drapes applied covering the operative field. Maximum barrier sterile technique with sterile gowns and gloves were used for the procedure. Local anesthesia was provided with 1% lidocaine.  Ultrasound was used to confirm patency of the right brachial vein. Under ultrasound guidance, a 21 gauge needle was advanced into the vein. A guidewire was advanced. Needle and guidewire were removed.  Ultrasound was then used to confirm patency of the left brachial vein. Under ultrasound guidance, a 21 gauge needle was advanced into the vein. A guidewire was advanced under fluoroscopy to the level of the left axilla. A peel-away sheath was placed over the wire. A 5 French PICC line was cut to 15 cm and advanced. The catheter was aspirated and flushed with saline. A venogram was then performed through the PICC line as it was retracted and spot images obtained. The PICC line was then removed and compression held over the entry site.  After creating a small venotomy  incision, a 21 gauge needle was advanced into the right internal jugular vein under direct, real-time ultrasound guidance. Ultrasound image documentation was performed. After securing guidewire access, a peel-away sheath was placed over a guide wire.  Utilizing guide wire measurement, a 5 [place], dual-lumen non tunneled catheter was cut to 14 cm.  The  catheter was then placed through the sheath and the sheath removed. Final catheter positioning was confirmed and documented with a fluoroscopic spot image. The catheter was aspirated, flushed with saline, and injected with appropriate volume heparin dwells. The catheter exit site was secured with 0-Prolene retention sutures.  COMPLICATIONS: None.  No pneumothorax.  FINDINGS: Under ultrasound, right upper arm veins were very diminutive in size. Access of the brachial vein was obtained with a 21 gauge needle with blood return. However, a guidewire would not advance more than a few cm in the vein. No other vein was available for venous access in the right upper arm. Right upper arm PICC line placement was therefore abandoned.  Under ultrasound, the left upper arm veins were also quite small in caliber. The cephalic vein is noted to be thrombosed in the upper arm. The brachial vein was able to be accessed with a guidewire able to be advanced into the axilla. A PICC line was cut short to function as a midline catheter. After advancing the PICC line into the axilla, there was no return of blood from the catheter. Contrast venogram shows extravasation in the axilla as well as diminutive caliber of veins in the left axilla and upper arm. Left arm catheter placement was therefore abandoned.  Decision was then made to place a non tunneled central venous catheter via the right internal jugular vein. The right internal jugular vein is widely patent by ultrasound. After right jugular central venous catheter placement, the tip lies at the cavoatrial junction. The catheter aspirates normally and is ready for immediate use.  IMPRESSION: Failed attempt at PICC line placement in both upper extremities. Bilateral upper arm veins are diminutive in size. Midline catheter placement on the left was also unsuccessful due to extravasation at the level of the axillary vein. The patient is no longer a good candidate for upper extremity PICC  lines bilaterally. Successful placement was performed of a non-tunneled central venous catheter via the right internal jugular vein. The catheter tip lies at the cavoatrial junction. The catheter is ready for immediate use.   Electronically Signed   By: Aletta Edouard M.D.   On: 04/18/2014 16:11   Ir Fluoro Guide Cv Line Right  04/18/2014   CLINICAL DATA:  Sickle cell anemia and need for central venous access for transfusion and more durable IV access.  EXAM: 1. ULTRASOUND-GUIDED VASCULAR ACCESS OF RIGHT BRACHIAL VEIN 2. ULTRASOUND-GUIDED VASCULAR ACCESS OF LEFT BRACHIAL VEIN 3. LEFT UPPER EXTREMITY VENOGRAPHY 4. NON-TUNNELED CENTRAL VENOUS CATHETER PLACEMENT WITH ULTRASOUND AND FLUOROSCOPIC GUIDANCE  FLUOROSCOPY TIME:  minutes  PROCEDURE: The procedure, risks, benefits, and alternatives were explained to the patient. Questions regarding the procedure were encouraged and answered. The patient understands and consents to the procedure.  During the course of the procedure, both upper arms as well as the right neck were prepped with chlorhexidine in a sterile fashion, and sterile drapes applied covering the operative field. Maximum barrier sterile technique with sterile gowns and gloves were used for the procedure. Local anesthesia was provided with 1% lidocaine.  Ultrasound was used to confirm patency of the right brachial vein. Under ultrasound guidance, a 21 gauge needle was  advanced into the vein. A guidewire was advanced. Needle and guidewire were removed.  Ultrasound was then used to confirm patency of the left brachial vein. Under ultrasound guidance, a 21 gauge needle was advanced into the vein. A guidewire was advanced under fluoroscopy to the level of the left axilla. A peel-away sheath was placed over the wire. A 5 French PICC line was cut to 15 cm and advanced. The catheter was aspirated and flushed with saline. A venogram was then performed through the PICC line as it was retracted and spot images  obtained. The PICC line was then removed and compression held over the entry site.  After creating a small venotomy incision, a 21 gauge needle was advanced into the right internal jugular vein under direct, real-time ultrasound guidance. Ultrasound image documentation was performed. After securing guidewire access, a peel-away sheath was placed over a guide wire.  Utilizing guide wire measurement, a 5 [place], dual-lumen non tunneled catheter was cut to 14 cm.  The catheter was then placed through the sheath and the sheath removed. Final catheter positioning was confirmed and documented with a fluoroscopic spot image. The catheter was aspirated, flushed with saline, and injected with appropriate volume heparin dwells. The catheter exit site was secured with 0-Prolene retention sutures.  COMPLICATIONS: None.  No pneumothorax.  FINDINGS: Under ultrasound, right upper arm veins were very diminutive in size. Access of the brachial vein was obtained with a 21 gauge needle with blood return. However, a guidewire would not advance more than a few cm in the vein. No other vein was available for venous access in the right upper arm. Right upper arm PICC line placement was therefore abandoned.  Under ultrasound, the left upper arm veins were also quite small in caliber. The cephalic vein is noted to be thrombosed in the upper arm. The brachial vein was able to be accessed with a guidewire able to be advanced into the axilla. A PICC line was cut short to function as a midline catheter. After advancing the PICC line into the axilla, there was no return of blood from the catheter. Contrast venogram shows extravasation in the axilla as well as diminutive caliber of veins in the left axilla and upper arm. Left arm catheter placement was therefore abandoned.  Decision was then made to place a non tunneled central venous catheter via the right internal jugular vein. The right internal jugular vein is widely patent by ultrasound.  After right jugular central venous catheter placement, the tip lies at the cavoatrial junction. The catheter aspirates normally and is ready for immediate use.  IMPRESSION: Failed attempt at PICC line placement in both upper extremities. Bilateral upper arm veins are diminutive in size. Midline catheter placement on the left was also unsuccessful due to extravasation at the level of the axillary vein. The patient is no longer a good candidate for upper extremity PICC lines bilaterally. Successful placement was performed of a non-tunneled central venous catheter via the right internal jugular vein. The catheter tip lies at the cavoatrial junction. The catheter is ready for immediate use.   Electronically Signed   By: Aletta Edouard M.D.   On: 04/18/2014 16:11   Ir US Guide Vasc Access Left  04/18/2014   CLINICAL DATA:  Sickle cell anemia and need for central venous access for transfusion and more durable IV access.  EXAM: 1. ULTRASOUND-GUIDED VASCULAR ACCESS OF RIGHT BRACHIAL VEIN 2. ULTRASOUND-GUIDED VASCULAR ACCESS OF LEFT BRACHIAL VEIN 3. LEFT UPPER EXTREMITY VENOGRAPHY 4. NON-TUNNELED CENTRAL  VENOUS CATHETER PLACEMENT WITH ULTRASOUND AND FLUOROSCOPIC GUIDANCE  FLUOROSCOPY TIME:  minutes  PROCEDURE: The procedure, risks, benefits, and alternatives were explained to the patient. Questions regarding the procedure were encouraged and answered. The patient understands and consents to the procedure.  During the course of the procedure, both upper arms as well as the right neck were prepped with chlorhexidine in a sterile fashion, and sterile drapes applied covering the operative field. Maximum barrier sterile technique with sterile gowns and gloves were used for the procedure. Local anesthesia was provided with 1% lidocaine.  Ultrasound was used to confirm patency of the right brachial vein. Under ultrasound guidance, a 21 gauge needle was advanced into the vein. A guidewire was advanced. Needle and guidewire were  removed.  Ultrasound was then used to confirm patency of the left brachial vein. Under ultrasound guidance, a 21 gauge needle was advanced into the vein. A guidewire was advanced under fluoroscopy to the level of the left axilla. A peel-away sheath was placed over the wire. A 5 French PICC line was cut to 15 cm and advanced. The catheter was aspirated and flushed with saline. A venogram was then performed through the PICC line as it was retracted and spot images obtained. The PICC line was then removed and compression held over the entry site.  After creating a small venotomy incision, a 21 gauge needle was advanced into the right internal jugular vein under direct, real-time ultrasound guidance. Ultrasound image documentation was performed. After securing guidewire access, a peel-away sheath was placed over a guide wire.  Utilizing guide wire measurement, a 5 [place], dual-lumen non tunneled catheter was cut to 14 cm.  The catheter was then placed through the sheath and the sheath removed. Final catheter positioning was confirmed and documented with a fluoroscopic spot image. The catheter was aspirated, flushed with saline, and injected with appropriate volume heparin dwells. The catheter exit site was secured with 0-Prolene retention sutures.  COMPLICATIONS: None.  No pneumothorax.  FINDINGS: Under ultrasound, right upper arm veins were very diminutive in size. Access of the brachial vein was obtained with a 21 gauge needle with blood return. However, a guidewire would not advance more than a few cm in the vein. No other vein was available for venous access in the right upper arm. Right upper arm PICC line placement was therefore abandoned.  Under ultrasound, the left upper arm veins were also quite small in caliber. The cephalic vein is noted to be thrombosed in the upper arm. The brachial vein was able to be accessed with a guidewire able to be advanced into the axilla. A PICC line was cut short to function as a  midline catheter. After advancing the PICC line into the axilla, there was no return of blood from the catheter. Contrast venogram shows extravasation in the axilla as well as diminutive caliber of veins in the left axilla and upper arm. Left arm catheter placement was therefore abandoned.  Decision was then made to place a non tunneled central venous catheter via the right internal jugular vein. The right internal jugular vein is widely patent by ultrasound. After right jugular central venous catheter placement, the tip lies at the cavoatrial junction. The catheter aspirates normally and is ready for immediate use.  IMPRESSION: Failed attempt at PICC line placement in both upper extremities. Bilateral upper arm veins are diminutive in size. Midline catheter placement on the left was also unsuccessful due to extravasation at the level of the axillary vein. The patient is  no longer a good candidate for upper extremity PICC lines bilaterally. Successful placement was performed of a non-tunneled central venous catheter via the right internal jugular vein. The catheter tip lies at the cavoatrial junction. The catheter is ready for immediate use.   Electronically Signed   By: Aletta Edouard M.D.   On: 04/18/2014 16:11   Ir US Guide Vasc Access Right  04/18/2014   CLINICAL DATA:  Sickle cell anemia and need for central venous access for transfusion and more durable IV access.  EXAM: 1. ULTRASOUND-GUIDED VASCULAR ACCESS OF RIGHT BRACHIAL VEIN 2. ULTRASOUND-GUIDED VASCULAR ACCESS OF LEFT BRACHIAL VEIN 3. LEFT UPPER EXTREMITY VENOGRAPHY 4. NON-TUNNELED CENTRAL VENOUS CATHETER PLACEMENT WITH ULTRASOUND AND FLUOROSCOPIC GUIDANCE  FLUOROSCOPY TIME:  minutes  PROCEDURE: The procedure, risks, benefits, and alternatives were explained to the patient. Questions regarding the procedure were encouraged and answered. The patient understands and consents to the procedure.  During the course of the procedure, both upper arms as well  as the right neck were prepped with chlorhexidine in a sterile fashion, and sterile drapes applied covering the operative field. Maximum barrier sterile technique with sterile gowns and gloves were used for the procedure. Local anesthesia was provided with 1% lidocaine.  Ultrasound was used to confirm patency of the right brachial vein. Under ultrasound guidance, a 21 gauge needle was advanced into the vein. A guidewire was advanced. Needle and guidewire were removed.  Ultrasound was then used to confirm patency of the left brachial vein. Under ultrasound guidance, a 21 gauge needle was advanced into the vein. A guidewire was advanced under fluoroscopy to the level of the left axilla. A peel-away sheath was placed over the wire. A 5 French PICC line was cut to 15 cm and advanced. The catheter was aspirated and flushed with saline. A venogram was then performed through the PICC line as it was retracted and spot images obtained. The PICC line was then removed and compression held over the entry site.  After creating a small venotomy incision, a 21 gauge needle was advanced into the right internal jugular vein under direct, real-time ultrasound guidance. Ultrasound image documentation was performed. After securing guidewire access, a peel-away sheath was placed over a guide wire.  Utilizing guide wire measurement, a 5 [place], dual-lumen non tunneled catheter was cut to 14 cm.  The catheter was then placed through the sheath and the sheath removed. Final catheter positioning was confirmed and documented with a fluoroscopic spot image. The catheter was aspirated, flushed with saline, and injected with appropriate volume heparin dwells. The catheter exit site was secured with 0-Prolene retention sutures.  COMPLICATIONS: None.  No pneumothorax.  FINDINGS: Under ultrasound, right upper arm veins were very diminutive in size. Access of the brachial vein was obtained with a 21 gauge needle with blood return. However, a  guidewire would not advance more than a few cm in the vein. No other vein was available for venous access in the right upper arm. Right upper arm PICC line placement was therefore abandoned.  Under ultrasound, the left upper arm veins were also quite small in caliber. The cephalic vein is noted to be thrombosed in the upper arm. The brachial vein was able to be accessed with a guidewire able to be advanced into the axilla. A PICC line was cut short to function as a midline catheter. After advancing the PICC line into the axilla, there was no return of blood from the catheter. Contrast venogram shows extravasation in the axilla as  well as diminutive caliber of veins in the left axilla and upper arm. Left arm catheter placement was therefore abandoned.  Decision was then made to place a non tunneled central venous catheter via the right internal jugular vein. The right internal jugular vein is widely patent by ultrasound. After right jugular central venous catheter placement, the tip lies at the cavoatrial junction. The catheter aspirates normally and is ready for immediate use.  IMPRESSION: Failed attempt at PICC line placement in both upper extremities. Bilateral upper arm veins are diminutive in size. Midline catheter placement on the left was also unsuccessful due to extravasation at the level of the axillary vein. The patient is no longer a good candidate for upper extremity PICC lines bilaterally. Successful placement was performed of a non-tunneled central venous catheter via the right internal jugular vein. The catheter tip lies at the cavoatrial junction. The catheter is ready for immediate use.   Electronically Signed   By: Aletta Edouard M.D.   On: 04/18/2014 16:11   Ir US Guide Vasc Access Right  04/18/2014   CLINICAL DATA:  Sickle cell anemia and need for central venous access for transfusion and more durable IV access.  EXAM: 1. ULTRASOUND-GUIDED VASCULAR ACCESS OF RIGHT BRACHIAL VEIN 2.  ULTRASOUND-GUIDED VASCULAR ACCESS OF LEFT BRACHIAL VEIN 3. LEFT UPPER EXTREMITY VENOGRAPHY 4. NON-TUNNELED CENTRAL VENOUS CATHETER PLACEMENT WITH ULTRASOUND AND FLUOROSCOPIC GUIDANCE  FLUOROSCOPY TIME:  minutes  PROCEDURE: The procedure, risks, benefits, and alternatives were explained to the patient. Questions regarding the procedure were encouraged and answered. The patient understands and consents to the procedure.  During the course of the procedure, both upper arms as well as the right neck were prepped with chlorhexidine in a sterile fashion, and sterile drapes applied covering the operative field. Maximum barrier sterile technique with sterile gowns and gloves were used for the procedure. Local anesthesia was provided with 1% lidocaine.  Ultrasound was used to confirm patency of the right brachial vein. Under ultrasound guidance, a 21 gauge needle was advanced into the vein. A guidewire was advanced. Needle and guidewire were removed.  Ultrasound was then used to confirm patency of the left brachial vein. Under ultrasound guidance, a 21 gauge needle was advanced into the vein. A guidewire was advanced under fluoroscopy to the level of the left axilla. A peel-away sheath was placed over the wire. A 5 French PICC line was cut to 15 cm and advanced. The catheter was aspirated and flushed with saline. A venogram was then performed through the PICC line as it was retracted and spot images obtained. The PICC line was then removed and compression held over the entry site.  After creating a small venotomy incision, a 21 gauge needle was advanced into the right internal jugular vein under direct, real-time ultrasound guidance. Ultrasound image documentation was performed. After securing guidewire access, a peel-away sheath was placed over a guide wire.  Utilizing guide wire measurement, a 5 [place], dual-lumen non tunneled catheter was cut to 14 cm.  The catheter was then placed through the sheath and the sheath  removed. Final catheter positioning was confirmed and documented with a fluoroscopic spot image. The catheter was aspirated, flushed with saline, and injected with appropriate volume heparin dwells. The catheter exit site was secured with 0-Prolene retention sutures.  COMPLICATIONS: None.  No pneumothorax.  FINDINGS: Under ultrasound, right upper arm veins were very diminutive in size. Access of the brachial vein was obtained with a 21 gauge needle with blood return. However, a  guidewire would not advance more than a few cm in the vein. No other vein was available for venous access in the right upper arm. Right upper arm PICC line placement was therefore abandoned.  Under ultrasound, the left upper arm veins were also quite small in caliber. The cephalic vein is noted to be thrombosed in the upper arm. The brachial vein was able to be accessed with a guidewire able to be advanced into the axilla. A PICC line was cut short to function as a midline catheter. After advancing the PICC line into the axilla, there was no return of blood from the catheter. Contrast venogram shows extravasation in the axilla as well as diminutive caliber of veins in the left axilla and upper arm. Left arm catheter placement was therefore abandoned.  Decision was then made to place a non tunneled central venous catheter via the right internal jugular vein. The right internal jugular vein is widely patent by ultrasound. After right jugular central venous catheter placement, the tip lies at the cavoatrial junction. The catheter aspirates normally and is ready for immediate use.  IMPRESSION: Failed attempt at PICC line placement in both upper extremities. Bilateral upper arm veins are diminutive in size. Midline catheter placement on the left was also unsuccessful due to extravasation at the level of the axillary vein. The patient is no longer a good candidate for upper extremity PICC lines bilaterally. Successful placement was performed of a  non-tunneled central venous catheter via the right internal jugular vein. The catheter tip lies at the cavoatrial junction. The catheter is ready for immediate use.   Electronically Signed   By: Aletta Edouard M.D.   On: 04/18/2014 16:11    Medications: I have reviewed the patient's current medications.  Assessment/Plan: A 28 year old man admitted with sickle cell painful crisis.  #1 sickle cell painful crisis: Patient is slowly improving but still in pain. We will continue to decrease his PCA dosing and continue with oral Medications.  #2 Sickle cell Anemia: H/H is slightly dropping but no evidence of hemolysis. Continue to monitor  #3 Leucocytosis: due to VOC. Continue therapy.  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 Continue monitoring.  LOS: 5 days   Naina Sleeper,LAWAL 04/19/2014, 5:52 PM

## 2014-04-20 LAB — CULTURE, BLOOD (ROUTINE X 2)
CULTURE: NO GROWTH
Culture: NO GROWTH

## 2014-04-20 MED ORDER — HYDROMORPHONE HCL 4 MG PO TABS
4.0000 mg | ORAL_TABLET | ORAL | Status: DC | PRN
  Administered 2014-04-20 – 2014-04-21 (×8): 4 mg via ORAL
  Filled 2014-04-20 (×8): qty 1

## 2014-04-20 MED ORDER — HYDROMORPHONE 0.3 MG/ML IV SOLN
INTRAVENOUS | Status: DC
  Administered 2014-04-20: 1.44 mg via INTRAVENOUS
  Administered 2014-04-20: 4.46 mg via INTRAVENOUS
  Administered 2014-04-21: 7.2 mg via INTRAVENOUS
  Administered 2014-04-21: 3 mg via INTRAVENOUS
  Administered 2014-04-21 (×2): 2.7 mg via INTRAVENOUS
  Administered 2014-04-21: 08:00:00 via INTRAVENOUS
  Filled 2014-04-20 (×2): qty 25

## 2014-04-20 NOTE — Progress Notes (Signed)
Subjective: Patient still complaining of pain. His medications include oral Dilaudid and the PCA pump. He has used 33.88 milligram of Dilaudid with 85 demands and 71 deliveries in the last 24 hours. Pain level is still 6/10. The loaded orally is lasting him somewhere between 2-3 hours instead of the 4 hours. He was to try and see if he can get it every 3 hours today so he can progress by tomorrow.  Objective: Vital signs in last 24 hours: Temp:  [97.8 F (36.6 C)-98.4 F (36.9 C)] 98.4 F (36.9 C) (07/02 1351) Pulse Rate:  [93-105] 94 (07/02 1351) Resp:  [10-18] 16 (07/02 1411) BP: (110-115)/(63-68) 115/65 mmHg (07/02 1351) SpO2:  [94 %-99 %] 96 % (07/02 1411) Weight change:  Last BM Date: 04/18/14  Intake/Output from previous day: 07/01 0701 - 07/02 0700 In: 720 [P.O.:720] Out: 2 [Urine:2] Intake/Output this shift: Total I/O In: 120 [P.O.:120] Out: 4 [Urine:3; Stool:1]  General appearance: alert, cooperative and no distress Eyes: conjunctivae/corneas clear. PERRL, EOM's intact. Fundi benign. Back: symmetric, no curvature. ROM normal. No CVA tenderness. Resp: clear to auscultation bilaterally Chest wall: no tenderness Cardio: regular rate and rhythm, S1, S2 normal, no murmur, click, rub or gallop GI: soft, non-tender; bowel sounds normal; no masses,  no organomegaly Extremities: extremities normal, atraumatic, no cyanosis or edema Pulses: 2+ and symmetric Skin: Skin color, texture, turgor normal. No rashes or lesions Neurologic: Grossly normal  Lab Results:  Recent Labs  04/18/14 0545 04/19/14 0436  WBC  --  10.9*  HGB 6.7* 6.3*  HCT 19.9* 18.8*  PLT  --  777*   BMET  Recent Labs  04/19/14 0436  NA 141  K 3.6*  CL 102  CO2 28  GLUCOSE 120*  BUN 8  CREATININE 0.56  CALCIUM 8.5    Studies/Results: No results found.  Medications: I have reviewed the patient's current medications.  Assessment/Plan: A 28 year old man admitted with sickle cell painful  crisis.  #1 sickle cell painful crisis: Patient doing better. We will change the oral Dilaudid to Q 3hrs.   #2 Sickle cell Anemia: H/H is stable.  #3 Leucocytosis: due to VOC. Continue therapy.  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  LOS: 6 days   Justin Sharp,LAWAL 04/20/2014, 3:27 PM

## 2014-04-21 LAB — COMPREHENSIVE METABOLIC PANEL
ALBUMIN: 3 g/dL — AB (ref 3.5–5.2)
ALT: 26 U/L (ref 0–53)
AST: 37 U/L (ref 0–37)
Alkaline Phosphatase: 132 U/L — ABNORMAL HIGH (ref 39–117)
Anion gap: 9 (ref 5–15)
BUN: 10 mg/dL (ref 6–23)
CALCIUM: 9.1 mg/dL (ref 8.4–10.5)
CO2: 29 mEq/L (ref 19–32)
CREATININE: 0.56 mg/dL (ref 0.50–1.35)
Chloride: 103 mEq/L (ref 96–112)
GFR calc Af Amer: >90 mL/min (ref >90)
GFR calc non Af Amer: >90 mL/min (ref >90)
Glucose, Bld: 112 mg/dL — ABNORMAL HIGH (ref 70–99)
Potassium: 4.2 mEq/L (ref 3.7–5.3)
Sodium: 141 mEq/L (ref 137–147)
TOTAL PROTEIN: 7.8 g/dL (ref 6.0–8.3)
Total Bilirubin: 0.9 mg/dL (ref 0.3–1.2)

## 2014-04-21 LAB — CBC WITH DIFFERENTIAL/PLATELET
BASOS ABS: 0.1 10*3/uL (ref 0.0–0.1)
Basophils Relative: 1 % (ref 0–1)
EOS ABS: 0.2 10*3/uL (ref 0.0–0.7)
Eosinophils Relative: 2 % (ref 0–5)
HCT: 19 % — ABNORMAL LOW (ref 39.0–52.0)
Hemoglobin: 6.5 g/dL — CL (ref 13.0–17.0)
LYMPHS ABS: 1.7 10*3/uL (ref 0.7–4.0)
Lymphocytes Relative: 23 % (ref 12–46)
MCH: 32.7 pg (ref 26.0–34.0)
MCHC: 34.2 g/dL (ref 30.0–36.0)
MCV: 95.5 fL (ref 78.0–100.0)
MONO ABS: 0.8 10*3/uL (ref 0.1–1.0)
Monocytes Relative: 11 % (ref 3–12)
Neutro Abs: 4.7 10*3/uL (ref 1.7–7.7)
Neutrophils Relative %: 63 % (ref 43–77)
PLATELETS: 707 10*3/uL — AB (ref 150–400)
RBC: 1.99 MIL/uL — ABNORMAL LOW (ref 4.22–5.81)
RDW: 20.7 % — ABNORMAL HIGH (ref 11.5–15.5)
WBC: 7.5 10*3/uL (ref 4.0–10.5)

## 2014-04-21 NOTE — Discharge Summary (Addendum)
Physician Discharge Summary  Patient ID: Justin Sharp MRN: [id number] DOB/AGE: 1986/10/05 28 y.o.  Admit date: 04/14/2014 Discharge date: 04/21/2014  Admission Diagnoses:  Discharge Diagnoses:  Principal Problem:   Acute sickle cell crisis Active Problems:   CAP (community acquired pneumonia)   Sickle cell anemia   Chest pain   Discharged Condition: good  Hospital Course: Patient was admitted with acute sickle cell painful crisis as well as community-acquired pneumonia. He was admitted treated with IV antibiotics as well as IV Dilaudid PCA. Is having chest pain but was evaluated for pulmonary embolism past history unbalanced negative. He did well with the treatment was prolonged. Patient has had gradual improvement and as of today his pain level is down to 3/10. He has been on oral Dilaudid which will discharge him home on. He denied any fever. No nausea vomiting or diarrhea. His hemoglobin has also stabilized. He was having fever that was presumed secondary to sickle cell crisis as his chest x-ray was clear. Also clinically was not consistent with a pneumonia. Antibiotics were subsequently discontinued. At this point is stable and ready for discharge.  Consults: None  Significant Diagnostic Studies: labs: CBCs CMP is chest x-ray as well as urinalysis were done.  Treatments: IV hydration, antibiotics: vancomycin and Zosyn and analgesia: Dilaudid  Discharge Exam: Blood pressure 100/62, pulse 96, temperature 98.5 F (36.9 C), temperature source Oral, resp. rate 16, height 6\' 1"  (1.854 m), weight 70 kg (154 lb 5.2 oz), SpO2 97.00%. General appearance: alert, cooperative and no distress Neck: no adenopathy, no carotid bruit, no JVD, supple, symmetrical, trachea midline and thyroid not enlarged, symmetric, no tenderness/mass/nodules Back: symmetric, no curvature. ROM normal. No CVA tenderness. Resp: clear to auscultation bilaterally Chest wall: no tenderness Cardio: regular rate and  rhythm, S1, S2 normal, no murmur, click, rub or gallop GI: soft, non-tender; bowel sounds normal; no masses,  no organomegaly Extremities: extremities normal, atraumatic, no cyanosis or edema Pulses: 2+ and symmetric Skin: Skin color, texture, turgor normal. No rashes or lesions Neurologic: Grossly normal  Disposition: 01-Home or Self Care     Medication List         acetaminophen 500 MG tablet  Commonly known as:  TYLENOL  Take 1,000 mg by mouth every 6 (six) hours as needed for fever.     folic acid 1 MG tablet  Commonly known as:  FOLVITE  Take 1 mg by mouth daily.     HYDROmorphone 8 MG tablet  Commonly known as:  DILAUDID  Take 8 mg by mouth every 4 (four) hours as needed for moderate pain. For pain     hydroxyurea 500 MG capsule  Commonly known as:  HYDREA  Take 1,000 mg by mouth daily. May take with food to minimize GI side effects.     XARELTO 20 MG Tabs tablet  Generic drug:  rivaroxaban  Take 20 mg by mouth daily.         SignedBarbette Merino 04/21/2014, 2:48 PM  Time spent 34 minutes

## 2014-04-21 NOTE — Progress Notes (Signed)
CRITICAL VALUE ALERT  Critical value received:  Hemoglobin 6.5  Date of notification:  04/21/14   Critical value read back:Yes.    Nurse who received alert:  Edwyna Ready  Midlevel notified (1st page):  [place]   Time of first page:  [phone]

## 2014-08-08 ENCOUNTER — Encounter (HOSPITAL_COMMUNITY): Payer: Self-pay | Admitting: Emergency Medicine

## 2014-08-08 ENCOUNTER — Emergency Department (HOSPITAL_COMMUNITY): Payer: Medicaid - Out of State

## 2014-08-08 ENCOUNTER — Emergency Department (HOSPITAL_COMMUNITY)
Admission: EM | Admit: 2014-08-08 | Discharge: 2014-08-08 | Disposition: A | Discharge status: Home / Self Care | Payer: Medicaid - Out of State | Attending: Emergency Medicine | Admitting: Emergency Medicine

## 2014-08-08 DIAGNOSIS — Z86711 Personal history of pulmonary embolism: Secondary | ICD-10-CM | POA: Insufficient documentation

## 2014-08-08 DIAGNOSIS — D5700 Hb-SS disease with crisis, unspecified: Secondary | ICD-10-CM | POA: Insufficient documentation

## 2014-08-08 DIAGNOSIS — R7401 Elevation of levels of liver transaminase levels: Secondary | ICD-10-CM

## 2014-08-08 DIAGNOSIS — E860 Dehydration: Secondary | ICD-10-CM | POA: Insufficient documentation

## 2014-08-08 DIAGNOSIS — R112 Nausea with vomiting, unspecified: Secondary | ICD-10-CM

## 2014-08-08 DIAGNOSIS — D649 Anemia, unspecified: Secondary | ICD-10-CM

## 2014-08-08 DIAGNOSIS — R740 Nonspecific elevation of levels of transaminase and lactic acid dehydrogenase [LDH]: Secondary | ICD-10-CM

## 2014-08-08 DIAGNOSIS — Z8679 Personal history of other diseases of the circulatory system: Secondary | ICD-10-CM | POA: Insufficient documentation

## 2014-08-08 DIAGNOSIS — D75839 Thrombocytosis, unspecified: Secondary | ICD-10-CM

## 2014-08-08 DIAGNOSIS — D473 Essential (hemorrhagic) thrombocythemia: Secondary | ICD-10-CM | POA: Insufficient documentation

## 2014-08-08 DIAGNOSIS — R197 Diarrhea, unspecified: Secondary | ICD-10-CM | POA: Insufficient documentation

## 2014-08-08 DIAGNOSIS — Z7901 Long term (current) use of anticoagulants: Secondary | ICD-10-CM | POA: Insufficient documentation

## 2014-08-08 DIAGNOSIS — R749 Abnormal serum enzyme level, unspecified: Secondary | ICD-10-CM | POA: Insufficient documentation

## 2014-08-08 DIAGNOSIS — D539 Nutritional anemia, unspecified: Secondary | ICD-10-CM | POA: Insufficient documentation

## 2014-08-08 DIAGNOSIS — Z79899 Other long term (current) drug therapy: Secondary | ICD-10-CM | POA: Insufficient documentation

## 2014-08-08 DIAGNOSIS — Z8673 Personal history of transient ischemic attack (TIA), and cerebral infarction without residual deficits: Secondary | ICD-10-CM | POA: Insufficient documentation

## 2014-08-08 DIAGNOSIS — R0789 Other chest pain: Secondary | ICD-10-CM | POA: Insufficient documentation

## 2014-08-08 LAB — URINALYSIS, ROUTINE W REFLEX MICROSCOPIC
GLUCOSE, UA: NEGATIVE mg/dL
HGB URINE DIPSTICK: NEGATIVE
Ketones, ur: NEGATIVE mg/dL
Nitrite: NEGATIVE
PH: 5.5 (ref 5.0–8.0)
Protein, ur: 100 mg/dL — AB
Specific Gravity, Urine: 1.017 (ref 1.005–1.030)
Urobilinogen, UA: 0.2 mg/dL (ref 0.0–1.0)

## 2014-08-08 LAB — COMPREHENSIVE METABOLIC PANEL
ALK PHOS: 278 U/L — AB (ref 39–117)
ALT: 57 U/L — ABNORMAL HIGH (ref 0–53)
AST: 41 U/L — ABNORMAL HIGH (ref 0–37)
Albumin: 4.2 g/dL (ref 3.5–5.2)
Anion gap: 17 — ABNORMAL HIGH (ref 5–15)
BILIRUBIN TOTAL: 1.1 mg/dL (ref 0.3–1.2)
BUN: 11 mg/dL (ref 6–23)
CO2: 24 meq/L (ref 19–32)
Calcium: 10.5 mg/dL (ref 8.4–10.5)
Chloride: 99 mEq/L (ref 96–112)
Creatinine, Ser: 0.64 mg/dL (ref 0.50–1.35)
GLUCOSE: 103 mg/dL — AB (ref 70–99)
Potassium: 4 mEq/L (ref 3.7–5.3)
SODIUM: 140 meq/L (ref 137–147)
Total Protein: 10.5 g/dL — ABNORMAL HIGH (ref 6.0–8.3)

## 2014-08-08 LAB — CBC WITH DIFFERENTIAL/PLATELET
Basophils Absolute: 0 10*3/uL (ref 0.0–0.1)
Basophils Relative: 0 % (ref 0–1)
Eosinophils Absolute: 0 10*3/uL (ref 0.0–0.7)
Eosinophils Relative: 0 % (ref 0–5)
HCT: 28.5 % — ABNORMAL LOW (ref 39.0–52.0)
Hemoglobin: 9.7 g/dL — ABNORMAL LOW (ref 13.0–17.0)
LYMPHS ABS: 1.3 10*3/uL (ref 0.7–4.0)
LYMPHS PCT: 11 % — AB (ref 12–46)
MCH: 30 pg (ref 26.0–34.0)
MCHC: 34 g/dL (ref 30.0–36.0)
MCV: 88.2 fL (ref 78.0–100.0)
Monocytes Absolute: 0.6 10*3/uL (ref 0.1–1.0)
Monocytes Relative: 5 % (ref 3–12)
Neutro Abs: 10.3 10*3/uL — ABNORMAL HIGH (ref 1.7–7.7)
Neutrophils Relative %: 84 % — ABNORMAL HIGH (ref 43–77)
PLATELETS: 771 10*3/uL — AB (ref 150–400)
RBC: 3.23 MIL/uL — AB (ref 4.22–5.81)
RDW: 19.6 % — ABNORMAL HIGH (ref 11.5–15.5)
WBC: 12.2 10*3/uL — AB (ref 4.0–10.5)

## 2014-08-08 LAB — URINE MICROSCOPIC-ADD ON

## 2014-08-08 LAB — RETICULOCYTES
RBC.: 3.23 MIL/uL — ABNORMAL LOW (ref 4.22–5.81)
Retic Count, Absolute: 229.3 10*3/uL — ABNORMAL HIGH (ref 19.0–186.0)
Retic Ct Pct: 7.1 % — ABNORMAL HIGH (ref 0.4–3.1)

## 2014-08-08 MED ORDER — ONDANSETRON HCL 4 MG/2ML IJ SOLN
4.0000 mg | Freq: Once | INTRAMUSCULAR | Status: AC
  Administered 2014-08-08: 4 mg via INTRAMUSCULAR

## 2014-08-08 MED ORDER — HYDROMORPHONE HCL 1 MG/ML IJ SOLN
1.0000 mg | Freq: Once | INTRAMUSCULAR | Status: AC
  Administered 2014-08-08: 1 mg via INTRAVENOUS
  Filled 2014-08-08: qty 1

## 2014-08-08 MED ORDER — SODIUM CHLORIDE 0.9 % IV BOLUS (SEPSIS)
1000.0000 mL | Freq: Once | INTRAVENOUS | Status: AC
  Administered 2014-08-08: 1000 mL via INTRAVENOUS

## 2014-08-08 MED ORDER — ONDANSETRON HCL 4 MG/2ML IJ SOLN
4.0000 mg | Freq: Once | INTRAMUSCULAR | Status: DC
  Filled 2014-08-08: qty 2

## 2014-08-08 MED ORDER — ONDANSETRON 8 MG PO TBDP: 8.0000 mg | ORAL_TABLET | Freq: Once | ORAL | Status: DC

## 2014-08-08 MED ORDER — HYDROMORPHONE HCL 1 MG/ML IJ SOLN
1.0000 mg | Freq: Once | INTRAMUSCULAR | Status: AC
  Administered 2014-08-08: 1 mg via INTRAMUSCULAR

## 2014-08-08 MED ORDER — ONDANSETRON HCL 4 MG/2ML IJ SOLN
4.0000 mg | Freq: Once | INTRAMUSCULAR | Status: AC
  Administered 2014-08-08: 4 mg via INTRAVENOUS
  Filled 2014-08-08: qty 2

## 2014-08-08 MED ORDER — OXYCODONE-ACETAMINOPHEN 5-325 MG PO TABS
1.0000 | ORAL_TABLET | Freq: Four times a day (QID) | ORAL | Status: AC | PRN
Start: 2014-08-08 — End: ?

## 2014-08-08 MED ORDER — ONDANSETRON 4 MG PO TBDP: 4.0000 mg | ORAL_TABLET | Freq: Three times a day (TID) | ORAL | Status: AC | PRN

## 2014-08-08 MED ORDER — HYDROMORPHONE HCL 1 MG/ML IJ SOLN
1.0000 mg | Freq: Once | INTRAMUSCULAR | Status: DC
  Filled 2014-08-08: qty 1

## 2014-08-08 NOTE — ED Notes (Signed)
Attempted x 2 to start IV ?

## 2014-08-08 NOTE — Discharge Instructions (Signed)
STAY WELL HYDRATED! You were very dehydrated today which caused your sickle cell pain, but your labs look improved from before. Follow up with your regular doctor in 3 days for ongoing management of your pain. Use percocet or home pain meds as directed, don't drive while taking them. Avoid triggers to your pain. Use zofran as directed, as needed for nausea. Return to the ER for changes or worsening symptoms.   Chest Pain (Nonspecific) It is often hard to give a specific diagnosis for the cause of chest pain. There is always a chance that your pain could be related to something serious, such as a heart attack or a blood clot in the lungs. You need to follow up with your health care provider for further evaluation. CAUSES   Heartburn.  Pneumonia or bronchitis.  Anxiety or stress.  Inflammation around your heart (pericarditis) or lung (pleuritis or pleurisy).  A blood clot in the lung.  A collapsed lung (pneumothorax). It can develop suddenly on its own (spontaneous pneumothorax) or from trauma to the chest.  Shingles infection (herpes zoster virus). The chest wall is composed of bones, muscles, and cartilage. Any of these can be the source of the pain.  The bones can be bruised by injury.  The muscles or cartilage can be strained by coughing or overwork.  The cartilage can be affected by inflammation and become sore (costochondritis). DIAGNOSIS  Lab tests or other studies may be needed to find the cause of your pain. Your health care provider may have you take a test called an ambulatory electrocardiogram (ECG). An ECG records your heartbeat patterns over a 24-hour period. You may also have other tests, such as:  Transthoracic echocardiogram (TTE). During echocardiography, sound waves are used to evaluate how blood flows through your heart.  Transesophageal echocardiogram (TEE).  Cardiac monitoring. This allows your health care provider to monitor your heart rate and rhythm in real  time.  Holter monitor. This is a portable device that records your heartbeat and can help diagnose heart arrhythmias. It allows your health care provider to track your heart activity for several days, if needed.  Stress tests by exercise or by giving medicine that makes the heart beat faster. TREATMENT   Treatment depends on what may be causing your chest pain. Treatment may include:  Acid blockers for heartburn.  Anti-inflammatory medicine.  Pain medicine for inflammatory conditions.  Antibiotics if an infection is present.  You may be advised to change lifestyle habits. This includes stopping smoking and avoiding alcohol, caffeine, and chocolate.  You may be advised to keep your head raised (elevated) when sleeping. This reduces the chance of acid going backward from your stomach into your esophagus. Most of the time, nonspecific chest pain will improve within 2-3 days with rest and mild pain medicine.  HOME CARE INSTRUCTIONS   If antibiotics were prescribed, take them as directed. Finish them even if you start to feel better.  For the next few days, avoid physical activities that bring on chest pain. Continue physical activities as directed.  Do not use any tobacco products, including cigarettes, chewing tobacco, or electronic cigarettes.  Avoid drinking alcohol.  Only take medicine as directed by your health care provider.  Follow your health care provider's suggestions for further testing if your chest pain does not go away.  Keep any follow-up appointments you made. If you do not go to an appointment, you could develop lasting (chronic) problems with pain. If there is any problem keeping an appointment,  call to reschedule. SEEK MEDICAL CARE IF:   Your chest pain does not go away, even after treatment.  You have a rash with blisters on your chest.  You have a fever. SEEK IMMEDIATE MEDICAL CARE IF:   You have increased chest pain or pain that spreads to your arm,  neck, jaw, back, or abdomen.  You have shortness of breath.  You have an increasing cough, or you cough up blood.  You have severe back or abdominal pain.  You feel nauseous or vomit.  You have severe weakness.  You faint.  You have chills. This is an emergency. Do not wait to see if the pain will go away. Get medical help at once. Call your local emergency services (911 in [place] Do not drive yourself to the hospital. MAKE SURE YOU:   Understand these instructions.  Will watch your condition.  Will get help right away if you are not doing well or get worse. Document Released: 07/16/2005 Document Revised: 10/11/2013 Document Reviewed: 05/11/2008 [facility] Patient Information 2015 [place], [place]. This information is not intended to replace advice given to you by your health care provider. Make sure you discuss any questions you have with your health care provider.  Dehydration, Adult Dehydration is when you lose more fluids from the body than you take in. Vital organs like the kidneys, brain, and heart cannot function without a proper amount of fluids and salt. Any loss of fluids from the body can cause dehydration.  CAUSES   Vomiting.  Diarrhea.  Excessive sweating.  Excessive urine output.  Fever. SYMPTOMS  Mild dehydration  Thirst.  Dry lips.  Slightly dry mouth. Moderate dehydration  Very dry mouth.  Sunken eyes.  Skin does not bounce back quickly when lightly pinched and released.  Dark urine and decreased urine production.  Decreased tear production.  Headache. Severe dehydration  Very dry mouth.  Extreme thirst.  Rapid, weak pulse (more than 100 beats per minute at rest).  Cold hands and feet.  Not able to sweat in spite of heat and temperature.  Rapid breathing.  Blue lips.  Confusion and lethargy.  Difficulty being awakened.  Minimal urine production.  No tears. DIAGNOSIS  Your caregiver will diagnose dehydration based on  your symptoms and your exam. Blood and urine tests will help confirm the diagnosis. The diagnostic evaluation should also identify the cause of dehydration. TREATMENT  Treatment of mild or moderate dehydration can often be done at home by increasing the amount of fluids that you drink. It is best to drink small amounts of fluid more often. Drinking too much at one time can make vomiting worse. Refer to the home care instructions below. Severe dehydration needs to be treated at the hospital where you will probably be given intravenous (IV) fluids that contain water and electrolytes. HOME CARE INSTRUCTIONS   Ask your caregiver about specific rehydration instructions.  Drink enough fluids to keep your urine clear or pale yellow.  Drink small amounts frequently if you have nausea and vomiting.  Eat as you normally do.  Avoid:  Foods or drinks high in sugar.  Carbonated drinks.  Juice.  Extremely hot or cold fluids.  Drinks with caffeine.  Fatty, greasy foods.  Alcohol.  Tobacco.  Overeating.  Gelatin desserts.  Wash your hands well to avoid spreading bacteria and viruses.  Only take over-the-counter or prescription medicines for pain, discomfort, or fever as directed by your caregiver.  Ask your caregiver if you should continue all prescribed and  over-the-counter medicines.  Keep all follow-up appointments with your caregiver. SEEK MEDICAL CARE IF:  You have abdominal pain and it increases or stays in one area (localizes).  You have a rash, stiff neck, or severe headache.  You are irritable, sleepy, or difficult to awaken.  You are weak, dizzy, or extremely thirsty. SEEK IMMEDIATE MEDICAL CARE IF:   You are unable to keep fluids down or you get worse despite treatment.  You have frequent episodes of vomiting or diarrhea.  You have blood or green matter (bile) in your vomit.  You have blood in your stool or your stool looks black and tarry.  You have not  urinated in 6 to 8 hours, or you have only urinated a small amount of very dark urine.  You have a fever.  You faint. MAKE SURE YOU:   Understand these instructions.  Will watch your condition.  Will get help right away if you are not doing well or get worse. Document Released: 10/06/2005 Document Revised: 12/29/2011 Document Reviewed: 05/26/2011 [facility] Patient Information 2015 [place], [place]. This information is not intended to replace advice given to you by your health care provider. Make sure you discuss any questions you have with your health care provider.  Nausea and Vomiting Nausea means you feel sick to your stomach. Throwing up (vomiting) is a reflex where stomach contents come out of your mouth. HOME CARE   Take medicine as told by your doctor.  Do not force yourself to eat. However, you do need to drink fluids.  If you feel like eating, eat a normal diet as told by your doctor.  Eat rice, wheat, potatoes, bread, lean meats, yogurt, fruits, and vegetables.  Avoid high-fat foods.  Drink enough fluids to keep your pee (urine) clear or pale yellow.  Ask your doctor how to replace body fluid losses (rehydrate). Signs of body fluid loss (dehydration) include:  Feeling very thirsty.  Dry lips and mouth.  Feeling dizzy.  Dark pee.  Peeing less than normal.  Feeling confused.  Fast breathing or heart rate. GET HELP RIGHT AWAY IF:   You have blood in your throw up.  You have black or bloody poop (stool).  You have a bad headache or stiff neck.  You feel confused.  You have bad belly (abdominal) pain.  You have chest pain or trouble breathing.  You do not pee at least once every 8 hours.  You have cold, clammy skin.  You keep throwing up after 24 to 48 hours.  You have a fever. MAKE SURE YOU:   Understand these instructions.  Will watch your condition.  Will get help right away if you are not doing well or get worse. Document Released:  03/24/2008 Document Revised: 12/29/2011 Document Reviewed: 03/07/2011 [facility] Patient Information 2015 [place], [place]. This information is not intended to replace advice given to you by your health care provider. Make sure you discuss any questions you have with your health care provider.  Rehydration, Adult Rehydration is the replacement of body fluids lost during dehydration. Dehydration is an extreme loss of body fluids to the point of body function impairment. There are many ways extreme fluid loss can occur, including vomiting, diarrhea, or excess sweating. Recovering from dehydration requires replacing lost fluids, continuing to eat to maintain strength, and avoiding foods and beverages that may contribute to further fluid loss or may increase nausea. HOW TO REHYDRATE In most cases, rehydration involves the replacement of not only fluids but also carbohydrates and basic  body salts. Rehydration with an oral rehydration solution is one way to replace essential nutrients lost through dehydration. An oral rehydration solution can be purchased at pharmacies, retail stores, and online. Premixed packets of powder that you combine with water to make a solution are also sold. You can prepare an oral rehydration solution at home by mixing the following ingredients together:    - tsp table salt.   tsp baking soda.   tsp salt substitute containing potassium chloride.  1 tablespoons sugar.  1 L (34 oz) of water. Be sure to use exact measurements. Including too much sugar can make diarrhea worse. Drink -1 cup (120-240 mL) of oral rehydration solution each time you have diarrhea or vomit. If drinking this amount makes your vomiting worse, try drinking smaller amounts more often. For example, drink 1-3 tsp every 5-10 minutes.  A general rule for staying hydrated is to drink 1-2 L of fluid per day. Talk to your caregiver about the specific amount you should be drinking each day. Drink enough fluids to  keep your urine clear or pale yellow. EATING WHEN DEHYDRATED Even if you have had severe sweating or you are having diarrhea, do not stop eating. Many healthy items in a normal diet are okay to continue eating while recovering from dehydration. The following tips can help you to lessen nausea when you eat:  Ask someone else to prepare your food. Cooking smells may worsen nausea.  Eat in a well-ventilated room away from cooking smells.  Sit up when you eat. Avoid lying down until 1-2 hours after eating.  Eat small amounts when you eat.  Eat foods that are easy to digest. These include soft, well-cooked, or mashed foods. FOODS AND BEVERAGES TO AVOID Avoid eating or drinking the following foods and beverages that may increase nausea or further loss of fluid:   Fruit juices with a high sugar content, such as concentrated juices.  Alcohol.  Beverages containing caffeine.  Carbonated drinks. They may cause a lot of gas.  Foods that may cause a lot of gas, such as cabbage, broccoli, and beans.  Fatty, greasy, and fried foods.  Spicy, very salty, and very sweet foods or drinks.  Foods or drinks that are very hot or very cold. Consume food or drinks at or near room temperature.  Foods that need a lot of chewing, such as raw vegetables.  Foods that are sticky or hard to swallow, such as peanut butter. Document Released: 12/29/2011 Document Revised: 06/30/2012 Document Reviewed: 12/29/2011 [facility] Patient Information 2015 [place], [place]. This information is not intended to replace advice given to you by your health care provider. Make sure you discuss any questions you have with your health care provider.  Sickle Cell Anemia, Adult Sickle cell anemia is a condition in which red blood cells have an abnormal "sickle" shape. This abnormal shape shortens the cells' life span, which results in a lower than normal concentration of red blood cells in the blood. The sickle shape also causes the  cells to clump together and block free blood flow through the blood vessels. As a result, the tissues and organs of the body do not receive enough oxygen. Sickle cell anemia causes organ damage and pain and increases the risk of infection. CAUSES  Sickle cell anemia is a genetic disorder. Those who receive two copies of the gene have the condition, and those who receive one copy have the trait. RISK FACTORS The sickle cell gene is most common in people whose families  originated in [place]. Other areas of the globe where sickle cell trait occurs include the [place], [place] and [place], and the [place].  SIGNS AND SYMPTOMS  Pain, especially in the extremities, back, chest, or abdomen (common). The pain may start suddenly or may develop following an illness, especially if there is dehydration. Pain can also occur due to overexertion or exposure to extreme temperature changes.  Frequent severe bacterial infections, especially certain types of pneumonia and meningitis.  Pain and swelling in the hands and feet.  Decreased activity.   Loss of appetite.   Change in behavior.  Headaches.  Seizures.  Shortness of breath or difficulty breathing.  Vision changes.  Skin ulcers. Those with the trait may not have symptoms or they may have mild symptoms.  DIAGNOSIS  Sickle cell anemia is diagnosed with blood tests that demonstrate the genetic trait. It is often diagnosed during the newborn period, due to mandatory testing nationwide. A variety of blood tests, X-rays, CT scans, MRI scans, ultrasounds, and lung function tests may also be done to monitor the condition. TREATMENT  Sickle cell anemia may be treated with:  Medicines. You may be given pain medicines, antibiotic medicines (to treat and prevent infections) or medicines to increase the production of certain types of hemoglobin.  Fluids.  Oxygen.  Blood transfusions. HOME CARE INSTRUCTIONS   Drink  enough fluid to keep your urine clear or pale yellow. Increase your fluid intake in hot weather and during exercise.  Do not smoke. Smoking lowers oxygen levels in the blood.   Only take over-the-counter or prescription medicines for pain, fever, or discomfort as directed by your health care provider.  Take antibiotics as directed by your health care provider. Make sure you finish them it even if you start to feel better.   Take supplements as directed by your health care provider.   Consider wearing a medical alert bracelet. This tells anyone caring for you in an emergency of your condition.   When traveling, keep your medical information, health care provider's names, and the medicines you take with you at all times.   If you develop a fever, do not take medicines to reduce the fever right away. This could cover up a problem that is developing. Notify your health care provider.  Keep all follow-up appointments with your health care provider. Sickle cell anemia requires regular medical care. SEEK MEDICAL CARE IF: You have a fever. SEEK IMMEDIATE MEDICAL CARE IF:   You feel dizzy or faint.   You have new abdominal pain, especially on the left side near the stomach area.   You develop a persistent, often uncomfortable and painful penile erection (priapism). If this is not treated immediately it will lead to impotence.   You have numbness your arms or legs or you have a hard time moving them.   You have a hard time with speech.   You have a fever or persistent symptoms for more than 2-3 days.   You have a fever and your symptoms suddenly get worse.   You have signs or symptoms of infection. These include:   Chills.   Abnormal tiredness (lethargy).   Irritability.   Poor eating.   Vomiting.   You develop pain that is not helped with medicine.   You develop shortness of breath.  You have pain in your chest.   You are coughing up pus-like or bloody  sputum.   You develop a stiff neck.  Your feet or hands  swell or have pain.  Your abdomen appears bloated.  You develop joint pain. MAKE SURE YOU:  Understand these instructions. Document Released: 01/14/2006 Document Revised: 02/20/2014 Document Reviewed: 05/18/2013 [facility] Patient Information 2015 [place], [place]. This information is not intended to replace advice given to you by your health care provider. Make sure you discuss any questions you have with your health care provider.

## 2014-08-08 NOTE — ED Notes (Signed)
Pt cannot urinate at this time. Will notify staff when able.

## 2014-08-08 NOTE — ED Provider Notes (Signed)
CSN: [id number]     Arrival date & time 08/08/14  1459 History   First MD Initiated Contact with Patient 08/08/14 1545     Chief Complaint  Patient presents with  . Sickle Cell Pain Crisis  . Chest Pain     (Consider location/radiation/quality/duration/timing/severity/associated sxs/prior Treatment) HPI Comments: Justin Sharp is a 28 y.o. male with a PMHx of sickle cell (SS) disease, CVA, PE, and acute chest syndrome, who presents to the ED with complaints of L sided chest pain x3 days consistent with his prior sickle cell pain crises. Pt states the pain is 10/10 constant sharp/shooting, radiating to his upper back, unrelieved by dilaudid 11m q4h, and without known aggravating factors, unchanged with breathing or exertion. Pt states the pain came on gradually. Endorses associated SOB with exertion, but denies cough, wheezing, hemoptysis, LE swelling, and reports this does not feel like when he had a PE. Compliant with all medications including xarelto. His brother recently died 3wks ago and since then pt has not been eating or drinking as well as he normally does due to not having much of an appetite, states he's been around a lot of people wanting to hug and kiss him and isn't sure if anyone was sick. Reports that yesterday he developed N/V, nonbloody nonbilious emesis with just stomach contents since he's not eating much, approx 5-10 episodes since yesterday, as well as some loose watery stools approx 10 episodes since yesterday. Denies fevers, chills, cough, URI symptoms, abd pain, constipation, obstipation, urinary changes, recent travel or changes in elevation, changes in medications, or other myalgias/arthralgias. Denies numbness or tingling anywhere. [facility] and [facility]  Patient is a 28y.o. male presenting with sickle cell pain and chest pain. The history is provided by the patient. No language interpreter was used.   Sickle Cell Pain Crisis Location:  Chest and back Severity:  Severe (10/10) Onset quality:  Gradual Duration:  3 days Similar to previous crisis episodes: yes   Timing:  Constant Progression:  Unchanged Chronicity:  Recurrent Sickle cell genotype:  SS Usual hemoglobin level:  7.5-8 History of pulmonary emboli: yes (on xarelto)   Context: dehydration   Relieved by:  Nothing Worsened by:  Nothing tried Ineffective treatments:  Prescription drugs (437mdilaudid q4h) Associated symptoms: chest pain, nausea, shortness of breath and vomiting   Associated symptoms: no congestion, no cough, no fever, no headaches, no leg ulcers, no priapism, no sore throat, no swelling of legs and no wheezing   Chest pain:    Quality:  Shooting and sharp   Severity:  Severe   Onset quality:  Gradual   Duration:  3 days   Timing:  Constant   Progression:  Unchanged   Chronicity:  Recurrent Nausea:    Severity:  Moderate   Onset quality:  Gradual   Duration:  1 day   Timing:  Constant   Progression:  Unchanged Vomiting:    Quality:  Stomach contents   Number of occurrences:  5-10   Severity:  Moderate   Duration:  1 day   Timing:  Constant   Progression:  Unchanged Risk factors: hx of pneumonia   Risk factors: not smoking   Risk factors comment:  Recent admission for pain crisis and infection Chest Pain Associated symptoms: back pain, nausea, shortness of breath and vomiting   Associated symptoms: no abdominal pain, no cough, no dizziness, no fever, no headache, no numbness, no palpitations  and no weakness     Past Medical History  Diagnosis Date  . Sickle cell anemia   . Stroke     states cva in 2009  . Acute coronary syndrome   . Pulmonary embolus    Past Surgical History  Procedure Laterality Date  . Appendectomy     Family History  Problem Relation Age of Onset  . Diabetes Other   . Coronary artery disease Other    History  Substance Use Topics  . Smoking status: Never  Smoker   . Smokeless tobacco: Never Used  . Alcohol Use: No    Review of Systems  Constitutional: Negative for fever and chills.  HENT: Negative for congestion, rhinorrhea and sore throat.   Respiratory: Positive for shortness of breath. Negative for cough and wheezing.   Cardiovascular: Positive for chest pain. Negative for palpitations and leg swelling.  Gastrointestinal: Positive for nausea, vomiting and diarrhea. Negative for abdominal pain and constipation.  Genitourinary: Negative for dysuria, urgency, hematuria, decreased urine volume and penile pain.  Musculoskeletal: Positive for back pain. Negative for arthralgias, joint swelling and myalgias.  Skin: Negative for color change.  Neurological: Negative for dizziness, syncope, weakness, light-headedness, numbness and headaches.   10 Systems reviewed and are negative for acute change except as noted in the HPI.    Allergies  Sulfa antibiotics and Sulfa drugs cross reactors  Home Medications   Prior to Admission medications   Medication Sig Start Date End Date Taking? Authorizing Provider  acetaminophen (TYLENOL) 500 MG tablet Take 1,000 mg by mouth every 6 (six) hours as needed for fever.    Historical Provider, MD  folic acid (FOLVITE) 1 MG tablet Take 1 mg by mouth daily.    Historical Provider, MD  HYDROmorphone (DILAUDID) 8 MG tablet Take 8 mg by mouth every 4 (four) hours as needed for moderate pain. For pain    Historical Provider, MD  hydroxyurea (HYDREA) 500 MG capsule Take 1,000 mg by mouth daily. May take with food to minimize GI side effects.    Historical Provider, MD  Rivaroxaban (XARELTO) 20 MG TABS Take 20 mg by mouth daily.    Historical Provider, MD   BP 105/63  Pulse 95  Temp(Src) 99 F (37.2 C) (Oral)  Resp 22  SpO2 100% Physical Exam  Nursing note and vitals reviewed. Constitutional: He is oriented to person, place, and time. Vital signs are normal. He appears well-developed and well-nourished.   Non-toxic appearance. No distress.  Afebrile, nontoxic, NAD but appears mildly fatigued  HENT:  Head: Normocephalic and atraumatic.  Mouth/Throat: Oropharynx is clear and moist. Mucous membranes are dry.  Dry mucous membranes  Eyes: Conjunctivae and EOM are normal. Right eye exhibits no discharge. Left eye exhibits no discharge. No scleral icterus.  Neck: Normal range of motion. Neck supple.  Cardiovascular: Normal rate, regular rhythm, normal heart sounds and intact distal pulses.  Exam reveals no gallop and no friction rub.   No murmur heard. RRR, nl s1/s2, no m/r/g  Pulmonary/Chest: Effort normal and breath sounds normal. No respiratory distress. He has no decreased breath sounds. He has no wheezes. He has no rhonchi. He has no rales. He exhibits no tenderness.  CTAB in all lung fields, SpO2 100% on RA No chest wall TTP  Abdominal: Soft. Normal appearance and bowel sounds are normal. He exhibits no distension. There is no tenderness. There is no rigidity, no rebound, no guarding, no CVA tenderness, no tenderness at McBurney's point and negative Murphy's  sign.  Musculoskeletal: Normal range of motion.  Neurological: He is alert and oriented to person, place, and time. He has normal strength. No sensory deficit.  Skin: Skin is warm, dry and intact. No rash noted.  No pedal edema  Psychiatric: He has a normal mood and affect.    ED Course  Procedures (including critical care time) Labs Review Labs Reviewed  CBC WITH DIFFERENTIAL - Abnormal; Notable for the following:    WBC 12.2 (*)    RBC 3.23 (*)    Hemoglobin 9.7 (*)    HCT 28.5 (*)    RDW 19.6 (*)    Platelets 771 (*)    Neutrophils Relative % 84 (*)    Neutro Abs 10.3 (*)    Lymphocytes Relative 11 (*)    All other components within normal limits  COMPREHENSIVE METABOLIC PANEL - Abnormal; Notable for the following:    Glucose, Bld 103 (*)    Total Protein 10.5 (*)    AST 41 (*)    ALT 57 (*)    Alkaline Phosphatase 278  (*)    Anion gap 17 (*)    All other components within normal limits  RETICULOCYTES - Abnormal; Notable for the following:    Retic Ct Pct 7.1 (*)    RBC. 3.23 (*)    Retic Count, Manual 229.3 (*)    All other components within normal limits  URINALYSIS, ROUTINE W REFLEX MICROSCOPIC - Abnormal; Notable for the following:    Color, Urine ORANGE (*)    APPearance CLOUDY (*)    Bilirubin Urine SMALL (*)    Protein, ur 100 (*)    Leukocytes, UA TRACE (*)    All other components within normal limits  URINE MICROSCOPIC-ADD ON - Abnormal; Notable for the following:    Bacteria, UA FEW (*)    All other components within normal limits    Imaging Review Dg Chest 2 View  08/08/2014   CLINICAL DATA:  Sickle cell crisis with left-sided chest pain and shortness of breath.  EXAM: CHEST - 2 VIEW  COMPARISON:  04/14/2014  FINDINGS: The heart size and mediastinal contours are within normal limits. There is no evidence of pulmonary edema, consolidation, pneumothorax, nodule or pleural fluid. The visualized skeletal structures are unremarkable.  IMPRESSION: No active disease.   Electronically Signed   By: Aletta Edouard M.D.   On: 08/08/2014 19:20     EKG Interpretation   Date/Time:  Tuesday August 08 2014 15:43:04 EDT Ventricular Rate:  89 PR Interval:  137 QRS Duration: 87 QT Interval:  355 QTC Calculation: 432 R Axis:   67 Text Interpretation:  Sinus rhythm No significant change since last  tracing Confirmed by YAO  MD, DAVID ([id number]) on 08/08/2014 5:09:07 PM      MDM   Final diagnoses:  Acute sickle cell crisis  Dehydration  Thrombocytosis  Chronic anemia  Transaminitis  Other chest pain  Nausea vomiting and diarrhea    28y/o male here with sickle cell pain crisis with CP, DOE, and N/V/D. VSS and no concerning s/sx for PE. Pt on xarelto. Will obtain labs and CXR to eval for acute chest syndrome, although doubt this is acute chest given VS WNL. Doubt cardiac etiology, and this is  likely related to dehydration which would explain is mild DOE (not true SOB). Will give nausea and pain meds, and fluids, and reassess shortly.  8:12 PM Delays with obtaining IV access, but able to finally get access and labs. CBC  w/diff showing some hemoconcentration, but WBC 12.2 (again, likely concentrate), H/H 9.7/28.5 which is improved from baseline, even taking into account the likely hemoconcentration, and baseline thrombocytosis. CMP with mildly elevated AST/ALT and alk phos which is likely related to dehydration, bili WNL. Retics 7.1 improved from baseline. CXR neg. EKG NSR. Pain slightly improved after 80m dilaudid, will give one more. Nausea improved and pt tolerating PO fluids. Doubt need for admission. Will push PO fluids since IVFs running slowly given IV access is in foot with 22G cath. Will plan for home soon.  9:23 PM Pain improved to 8/10, nausea slightly worse. One more dose of dilaudid and zofran then home. Pt will give uKoreaurine now. Appears improved, VS all stable and tolerating PO well.  10:23 PM Pain and nausea improved. Awaiting U/A.   11:23 PM Urine showing small bili, proteinuria, trace leuks but WBC 0-2, few bacteria, doubt UTI given no symptoms, this is likely just a dehydrated concentrated urine given how long it took to collect. Pain and nausea meds given. Will have him f/up in 3 days with PCP. Discussed good oral hydration. I explained the diagnosis and have given explicit precautions to return to the ER including for any other new or worsening symptoms. The patient understands and accepts the medical plan as it's been dictated and I have answered their questions. Discharge instructions concerning home care and prescriptions have been given. The patient is STABLE and is discharged to home in good condition.  BP 115/63  Pulse 85  Temp(Src) 99 F (37.2 C) (Oral)  Resp 16  SpO2 100%  Meds ordered this encounter  Medications  . sodium chloride 0.9 % bolus 1,000 mL     Sig:   . DISCONTD: ondansetron (ZOFRAN) injection 4 mg    Sig:   . DISCONTD: HYDROmorphone (DILAUDID) injection 1 mg    Sig:   . HYDROmorphone (DILAUDID) injection 1 mg    Sig:   . DISCONTD: ondansetron (ZOFRAN-ODT) disintegrating tablet 8 mg    Sig:   . ondansetron (ZOFRAN) injection 4 mg    Sig:   . HYDROmorphone (DILAUDID) injection 1 mg    Sig:   . HYDROmorphone (DILAUDID) injection 1 mg    Sig:   . HYDROmorphone (DILAUDID) injection 1 mg    Sig:   . ondansetron (ZOFRAN) injection 4 mg    Sig:   . ondansetron (ZOFRAN ODT) 4 MG disintegrating tablet    Sig: Take 1 tablet (4 mg total) by mouth every 8 (eight) hours as needed for nausea or vomiting.    Dispense:  15 tablet    Refill:  0    Order Specific Question:  Supervising Provider    Answer:  MNoemi ChapelD [[[id number]] . oxyCODONE-acetaminophen (PERCOCET) 5-325 MG per tablet    Sig: Take 1-2 tablets by mouth every 6 (six) hours as needed for severe pain.    Dispense:  10 tablet    Refill:  0    Order Specific Question:  Supervising Provider    Answer:  MJohnna Acosta[[[id number]]    MChama PA-C 08/08/14 2324

## 2014-08-08 NOTE — ED Notes (Signed)
Pt was given ice water per his preference for oral challenge.

## 2014-08-08 NOTE — ED Notes (Signed)
Pt tolerating ice water well.  Pt was given 2nd cup of ice water at this time.

## 2014-08-08 NOTE — ED Notes (Addendum)
Pt was ask to give a urine sample, pt stated that he could not and he was just getting Iv Fluids, RN notified will attempt again

## 2014-08-08 NOTE — ED Provider Notes (Signed)
Medical screening examination/treatment/procedure(s) were performed by non-physician practitioner and as supervising physician I was immediately available for consultation/collaboration.   EKG Interpretation   Date/Time:  Tuesday August 08 2014 15:43:04 EDT Ventricular Rate:  89 PR Interval:  137 QRS Duration: 87 QT Interval:  355 QTC Calculation: 432 R Axis:   67 Text Interpretation:  Sinus rhythm No significant change since last  tracing Confirmed by YAO  MD, DAVID ([id number]) on 08/08/2014 5:09:07 PM        Wandra Arthurs, MD 08/08/14 2330

## 2014-08-08 NOTE — ED Notes (Signed)
Pt c/o Sickle Cell Crisis x 3 days w/ L side chest pain, SOB, back pain, and emesis.  Pain score 10/10.  Pt reports taking medications as prescribed.

## 2016-10-20 DEATH — deceased
